# Patient Record
Sex: Female | Born: 1935 | Race: White | Hispanic: No | Smoking: Never smoker
Health system: Southern US, Community
[De-identification: ages and names within clinical notes are randomized; demographics above are authoritative.]

## PROBLEM LIST (undated history)

## (undated) DIAGNOSIS — C50919 Malignant neoplasm of unspecified site of unspecified female breast: Secondary | ICD-10-CM

## (undated) DIAGNOSIS — I499 Cardiac arrhythmia, unspecified: Secondary | ICD-10-CM

## (undated) DIAGNOSIS — M1990 Unspecified osteoarthritis, unspecified site: Secondary | ICD-10-CM

## (undated) DIAGNOSIS — N189 Chronic kidney disease, unspecified: Secondary | ICD-10-CM

## (undated) DIAGNOSIS — I5189 Other ill-defined heart diseases: Secondary | ICD-10-CM

## (undated) DIAGNOSIS — K219 Gastro-esophageal reflux disease without esophagitis: Secondary | ICD-10-CM

## (undated) DIAGNOSIS — F0390 Unspecified dementia without behavioral disturbance: Secondary | ICD-10-CM

## (undated) DIAGNOSIS — E785 Hyperlipidemia, unspecified: Secondary | ICD-10-CM

## (undated) DIAGNOSIS — R002 Palpitations: Secondary | ICD-10-CM

## (undated) DIAGNOSIS — R55 Syncope and collapse: Secondary | ICD-10-CM

## (undated) DIAGNOSIS — C801 Malignant (primary) neoplasm, unspecified: Secondary | ICD-10-CM

## (undated) DIAGNOSIS — R413 Other amnesia: Secondary | ICD-10-CM

## (undated) DIAGNOSIS — M810 Age-related osteoporosis without current pathological fracture: Secondary | ICD-10-CM

## (undated) DIAGNOSIS — I10 Essential (primary) hypertension: Secondary | ICD-10-CM

## (undated) HISTORY — DX: Chronic kidney disease, unspecified: N18.9

## (undated) HISTORY — DX: Essential (primary) hypertension: I10

## (undated) HISTORY — DX: Cardiac arrhythmia, unspecified: I49.9

## (undated) HISTORY — DX: Palpitations: R00.2

## (undated) HISTORY — DX: Unspecified osteoarthritis, unspecified site: M19.90

## (undated) HISTORY — DX: Malignant neoplasm of unspecified site of unspecified female breast: C50.919

## (undated) HISTORY — DX: Syncope and collapse: R55

## (undated) HISTORY — DX: Other ill-defined heart diseases: I51.89

## (undated) HISTORY — DX: Malignant (primary) neoplasm, unspecified: C80.1

## (undated) HISTORY — DX: Unspecified dementia, unspecified severity, without behavioral disturbance, psychotic disturbance, mood disturbance, and anxiety: F03.90

## (undated) HISTORY — DX: Hyperlipidemia, unspecified: E78.5

## (undated) HISTORY — DX: Other amnesia: R41.3

## (undated) HISTORY — DX: Gastro-esophageal reflux disease without esophagitis: K21.9

## (undated) HISTORY — DX: Age-related osteoporosis without current pathological fracture: M81.0

## (undated) HISTORY — PX: OTHER SURGICAL HISTORY: SHX169

---

## 2006-08-05 ENCOUNTER — Inpatient Hospital Stay: Payer: Self-pay | Admitting: Cardiovascular Disease

## 2006-08-05 ENCOUNTER — Other Ambulatory Visit: Payer: Self-pay

## 2006-10-04 ENCOUNTER — Ambulatory Visit: Payer: Self-pay | Admitting: Cardiology

## 2006-10-13 ENCOUNTER — Encounter: Payer: Self-pay | Admitting: Internal Medicine

## 2006-10-13 ENCOUNTER — Ambulatory Visit: Payer: Self-pay

## 2006-10-28 ENCOUNTER — Ambulatory Visit: Payer: Self-pay | Admitting: Cardiology

## 2007-01-04 ENCOUNTER — Emergency Department (HOSPITAL_COMMUNITY): Admission: EM | Admit: 2007-01-04 | Discharge: 2007-01-04 | Payer: Self-pay | Admitting: Family Medicine

## 2007-02-04 ENCOUNTER — Ambulatory Visit: Payer: Self-pay | Admitting: Cardiology

## 2007-02-17 ENCOUNTER — Ambulatory Visit: Payer: Self-pay | Admitting: Cardiology

## 2007-08-03 ENCOUNTER — Ambulatory Visit: Payer: Self-pay | Admitting: Rheumatology

## 2007-11-10 ENCOUNTER — Ambulatory Visit: Payer: Self-pay | Admitting: Cardiology

## 2007-11-10 ENCOUNTER — Ambulatory Visit: Payer: Self-pay | Admitting: Pain Medicine

## 2007-11-28 ENCOUNTER — Ambulatory Visit: Payer: Self-pay | Admitting: Pain Medicine

## 2008-11-19 ENCOUNTER — Ambulatory Visit: Payer: Self-pay | Admitting: Internal Medicine

## 2009-02-06 ENCOUNTER — Encounter: Payer: Self-pay | Admitting: Internal Medicine

## 2009-02-06 ENCOUNTER — Ambulatory Visit: Payer: Self-pay | Admitting: Internal Medicine

## 2009-02-06 DIAGNOSIS — R002 Palpitations: Secondary | ICD-10-CM | POA: Insufficient documentation

## 2009-02-06 DIAGNOSIS — I10 Essential (primary) hypertension: Secondary | ICD-10-CM

## 2009-07-25 ENCOUNTER — Encounter: Admission: RE | Admit: 2009-07-25 | Discharge: 2009-07-25 | Payer: Self-pay | Admitting: Neurology

## 2010-10-29 ENCOUNTER — Telehealth: Payer: Self-pay | Admitting: Internal Medicine

## 2010-12-19 ENCOUNTER — Encounter: Payer: Self-pay | Admitting: Internal Medicine

## 2010-12-19 ENCOUNTER — Ambulatory Visit
Admission: RE | Admit: 2010-12-19 | Discharge: 2010-12-19 | Payer: Self-pay | Source: Home / Self Care | Attending: Internal Medicine | Admitting: Internal Medicine

## 2010-12-19 DIAGNOSIS — I5032 Chronic diastolic (congestive) heart failure: Secondary | ICD-10-CM | POA: Insufficient documentation

## 2010-12-19 DIAGNOSIS — I498 Other specified cardiac arrhythmias: Secondary | ICD-10-CM | POA: Insufficient documentation

## 2010-12-21 ENCOUNTER — Encounter: Payer: Self-pay | Admitting: Neurology

## 2010-12-30 NOTE — Progress Notes (Signed)
Summary: RX  Phone Note Refill Request Call back at Home Phone [phone] Message from:  Patient on October 29, 2010 1:15 PM  Pt needs medication called in for dental work.  [facility] [address].  Initial call taken by: Harlon Flor,  October 29, 2010 1:15 PM  Follow-up for Phone Call        Pt's daughter is calling about this again. Follow-up by: Harlon Flor,  October 30, 2010 11:18 AM  Additional Follow-up for Phone Call Additional follow up Details #1::        Please let me know if needs pre meds. for dental work. Additional Follow-up by: Bishop Dublin, CMA,  October 31, 2010 11:59 AM    Additional Follow-up for Phone Call Additional follow up Details #2::    Notified daughter per Dr. Gala Romney no need for pre meds (antibiotics) for dental work.  Follow-up by: Bishop Dublin, CMA,  October 31, 2010 5:00 PM

## 2011-01-01 NOTE — Assessment & Plan Note (Signed)
Summary: ROV/AMD   Visit Type:  Follow-up Primary Provider:  Dr. Burnett Sheng  CC:  c/o fluttering in chest with fatigue.Marland Kitchen  History of Present Illness: Erin Shannon is a 77 year old woman with a history of palpitations, diastolic dysfunction and dementia. She returns today for 2 year followup.  She says she is feeling well. She denies palpitations but son says she gets occasional chest flutters.  Says breathing is pretty good. Main limitation is in R knee.  She denies any orthopnea, PND or lower extremity edema. She has not had any chest pain.  Preventive Screening-Counseling & Management  Alcohol-Tobacco     Smoking Status: never  Caffeine-Diet-Exercise     Does Patient Exercise: yes      Drug Use:  no.    Current Medications (verified): 1)  Namenda 10 Mg Tabs (Memantine Hcl) .Marland Kitchen.. 1 By Mouth Twice Daily 2)  Aricept 10 Mg Tabs (Donepezil Hcl) .Marland Kitchen.. 1 By Mouth Daily 3)  Cardizem Cd 120 Mg Xr24h-Cap (Diltiazem Hcl Coated Beads) .Marland Kitchen.. 1 By Mouth Twice Daily 4)  Lutein 6 Mg Caps (Lutein) .Marland Kitchen.. 1 By Mouth Daily 5)  Pravastatin Sodium 40 Mg Tabs (Pravastatin Sodium) .Marland Kitchen.. 1 By Mouth Daily 6)  Complete Womens  Tabs (Multiple Vitamins-Minerals) .... One Once Daily 7)  Calcium Citrate With Vitamin D 630mg /500 .... One Daily 8)  Tramadol Hcl 50 Mg Tabs (Tramadol Hcl) .... One Tablet Every 4-6 Hours As Needed  Allergies (verified): No Known Drug Allergies  Past History:  Past Medical History: Last updated: 02/05/2009  1. Palpitations     a.  monitor with PACs/PVCs     b.  negative myoview 2007  2. Diastolic dysfunction     a. echo 11/07. EF 60-65%. mild to moderate PI  3. Breast cancer s/p mastectomy  4. Memory loss  5. GERD  6. HTN  7. Hyperlipidemia  Social History: Last updated: 12/19/2010 Widowed  Retired  Tobacco Use - No.  Alcohol Use - no Regular Exercise - yes works in yard Drug Use - no  Risk Factors: Exercise: yes (12/19/2010)  Risk Factors: Smoking Status: never  (12/19/2010)  Past Surgical History: hysterectomy mastectomy right  Social History: Widowed  Retired  Tobacco Use - No.  Alcohol Use - no Regular Exercise - yes works in yard Drug Use - no Smoking Status:  never Does Patient Exercise:  yes Drug Use:  no  Review of Systems       As per HPI and past medical history; otherwise all systems negative.   Vital Signs:  Patient profile:   77 year old female Height:      57 inches Weight:      133 pounds BMI:     28.88 Pulse rate:   58 / minute BP sitting:   146 / 72  (left arm) Cuff size:   regular  Vitals Entered By: Bishop Dublin, CMA (December 19, 2010 2:17 PM)  Physical Exam  General:   well appearing. no resp difficulty HEENT: normal Neck: supple. no JVD. Carotids 2+ bilat; not bruits. No lymphadenopathy or thryomegaly appreciated. Cor: PMI nondisplaced  Bradycardid and regular No rubs, murmur +s4 Lungs: clear Abdomen: soft, nontender, nondistended. No hepatosplenomegaly. No bruits or masses. Good bowel sounds. Extremities: no cyanosis, clubbing, rash, edema Neuro: alert , cranial nerves grossly intact. moves all 4 extremities w/o difficulty. affect pleasant    Impression & Recommendations:  Problem # 1:  PALPITATIONS (ICD-785.1) Very mild. Continue current regimen.  Problem # 2:  BRADYCARDIA (ICD-427.89)  Mildly bradycardic with signifcant 1AVB. This is asymptomatic. I considered stopping cardizem but it is low dose and seems to be suppressing palpitations well so will continue. Told her to contact us if she develops dizziness or presyncope. No neer for pacer at this point.   Problem # 3:  DIASTOLIC HEART FAILURE, CHRONIC (ICD-428.32) Volume status looks good. Continue as needed lasix.

## 2011-04-14 NOTE — Assessment & Plan Note (Signed)
[facility]                            CARDIOLOGY OFFICE NOTE   Erin Shannon, Erin Shannon                        MRN:          [id number]  DATE:11/10/2007                            DOB:          Apr 25, 1927    Ms. Mcdougald returns today for further management of her palpitations.   Her biggest complaint today is that of fatigue.  When I first met her  last November, this was also her major complaint.  After eliminating  metoprolol extended release at 200 mg a day, she felt somewhat better.   On her last visit, she had a terrible taste in her mouth.  We eliminated  fish oil, flax seed oil.  It seems to have helped.   Her son is very concerned about her lack of energy and frequent naps.  She has seen Dr. Burnett Sheng recently who has done what sounds like  extensive blood work and an evaluation on this.  No etiology has been  found per her son.   They would like to cut her Diltiazem back.  For whatever reason, she is  taking Diltiazem 120 mg b.i.d.  The last time we saw her she was taking  Diltiazem extended release 120 daily.  She is on furosemide 20 mg a day,  Aricept 10 mg a day, potassium 10 mEq a day, and Namenda 10 mg b.i.d.   Her blood pressure today is elevated at 173/87.  She is very upset  because she had to wait, she said, an hour and a half in the waiting  area.  Her pulse is 61 and regular, weight is 113.  She is down 8  pounds.  HEENT:  Unchanged.  Carotid upstrokes were equal bilaterally without  bruits.  Thyroid is not enlarged, trachea is midline.  LUNGS:  Clear.  HEART:  Reveals a respiratory rate.  ABDOMINAL EXAM:  Soft, good bowel sounds.  EXTREMITIES:  Reveal no edema.  Pulses are intact.  NEURO EXAM:  Intact.   EKG shows sinus rhythm with a 1st degree AV block with no RSR prime in  V1 with occasional PVC.  Compared to her previous EKG, there has been no  significant change except for the RSR prime in V1.  She has a 1st degree  AV block  which was there before.   ASSESSMENT AND PLAN:  I am not sure what is causing Ms. Feil's  multiple complaints, especially her profound fatigue.  I will leave this  to her primary to investigate this, which it  sounds like he already has initiated.  Will decrease the Diltiazem to  Diltiazem extended release 120 daily.  I will see her back on a p.r.n.  basis.     Thomas C. Daleen Squibb, MD, [facility]  Electronically Signed    TCW/MedQ  DD: 11/10/2007  DT: 11/11/2007  Job #: [id number]   cc:   Jerl Mina

## 2011-04-14 NOTE — Assessment & Plan Note (Signed)
[facility] OFFICE NOTE   Erin Shannon, Erin Shannon                        MRN:          [id number]  DATE:11/19/2008                            DOB:          Dec 30, 1926    PRIMARY CARE PHYSICIAN:  Jerl Mina   INTERVAL HISTORY:  Erin Shannon is 77 year old woman previously followed  by Dr. Juanito Doom for palpitations.  She has no documented history of  coronary artery disease.  She did have a Myoview in November 2007, which  showed normal perfusion.  Ejection fraction was not calculated due to  frequent PACs and PVCs.  She did have an echocardiogram, which showed an  EF of 60%-65% with diastolic dysfunction and mild-to-moderate pulmonic  regurgitation and mild mitral regurgitation.   She returns today for followup.  She says that she frequently feels her  heart is beating strong.  She said this is primarily when she is pushing  herself out and running around.  She has not had any chest pain or  tachypalpitations.  She says she can tell that her heart is beating  strong.  She has not had any syncope or presyncope.  No dyspnea.  When  she notices these events, she takes an extra half pill of diltiazem and  helps a little bit.   The remainder review of systems is also notable for breast cancer,  status post mastectomy, some memory loss, gastroesophageal reflux  disease, hypertension, and hyperlipidemia.  She has some mild plaquing  in her carotid arteries.   CURRENT MEDICATIONS:  1. Namenda 10 b.i.d.  2. Diltiazem 120 b.i.d.  3. Lutein 6 mg a day.  4. Pravastatin 40 a day.  5. Lasix 20 mg p.r.n.   PHYSICAL EXAMINATION:  GENERAL:  She is an elderly woman, no acute  distress.  She ambulates around the clinic without any respiratory  difficulty.  VITAL SIGNS:  Blood pressure is 134/70, heart rate 72, and weight is  115.  HEENT:  Normal.  NECK:  Supple with no obvious JVD.  She does have some kyphosis.  Carotids are  2+ bilaterally without any bruits.  There is no  lymphadenopathy or thyromegaly.  CARDIAC:  PMI is nondisplaced.  Regular rate and rhythm with S4.  No  murmurs.  LUNGS:  Clear.  ABDOMEN:  Soft, nontender, and nondistended.  No hepatosplenomegaly.  No  bruits.  No masses.  Good bowel sounds.  EXTREMITIES:  Warm with no cyanosis, clubbing, or edema.  SKIN:  No rash.  NEUROLOGIC:  Alert and oriented x3.  Cranial nerves II through XII are  intact.  Moves all 4 extremities without difficulty.  Affect is  pleasant.   EKG shows sinus rhythm with a first-degree AV block.  His heart rate is  72.  PR duration is 242 milliseconds.   ASSESSMENT AND PLAN:  1. Palpitations.  We will go ahead and put a 2-week LifeWatch monitor      on her to further evaluate.  I told her it is okay to continue to      take an occasional  tablet of diltiazem p.r.n. as needed.  2. Chronic hypertension.  Blood pressure is mildly elevated.  We will      continue current therapy.  If it jumps up any further, I would      consider either increasing her diltiazem or adding another agent.  3. Hyperlipidemia, is followed by Dr. Burnett Sheng.   DISPOSITION:  We will see her back in 4-6 weeks to see how she is doing.     Bevelyn Buckles. Bensimhon, MD  Electronically Signed    DRB/MedQ  DD: 11/19/2008  DT: 11/20/2008  Job #: [id number]   cc:   Jerl Mina

## 2011-04-17 NOTE — Assessment & Plan Note (Signed)
[facility] OFFICE NOTE   Erin, Shannon                        MRN:          [id number]  DATE:10/04/2006                            DOB:          1927-06-02    I was asked by Dr. Lacie Scotts to evaluate Erin Shannon, a delightful 77-year-  old quite active widowed white female for increased shortness of breath and  palpitations.   HISTORY OF PRESENT ILLNESS:  She is very active at home in her garden.  She  lives alone.  About a month ago she had an episode where her heart was  racing and skipping and had some shortness of breath.  She went to the  [facility].  She was admitted and apparently  discharged on metoprolol extended-release 200 mg a day.   She does not remember any of the real details of that hospitalization; I  have no records today.  She was told she did not have a heart attack.   Since that time she has been extremely fatigued.  She has had a lot of  dyspnea on exertion which has been progressively worse over the last few  weeks.  She denies any chest tightness/pressure otherwise.   She has had no presyncope or syncope.   Her cardiac risk factors include age, sex, hypertension, hyperlipidemia, and  a history of cerebrovascular disease.  She has had carotid Dopplers in the  past and been told that she has some plaque in her blood vessels in her  neck.  She is on Plavix for this.   PAST MEDICAL HISTORY:  She is currently on:  1. Caduet 10/80 one daily.  2. Celebrex 200 mg p.o. b.i.d.  3. Omeprazole 20 mg a day.  4. Plavix 75 mg a day.  5. Zyrtec D one p.o. b.i.d.  6. Metoprolol extended-release 200 mg a day.  7. Fosamax 70 mg a day.  8. Multivitamin daily.   She does not smoke, drink or use any recreational drugs.  She rarely drinks  caffeinated beverages.  She does enjoy working in her garden and her yard  and she also bicycles!   She has had a hysterectomy in  late 1990, had a history of breast cancer  status post right mastectomy.  She did not have chemo.   FAMILY HISTORY:  Is really noncontributory.   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  She lives alone.  She has two children and one adopted child.   REVIEW OF SYSTEMS:  She denies any history of any allergies of hay fever, no  asthma, no history of any emphysema or chronic lung disease.  She has had no  gastrointestinal problems other than some occasional reflux.  She denies any  melena, hematochezia.  She has arthritis.  She has had a history of cancer  as noted above.  She has had no history of endocrinology problems and denies  any weight loss, weight gain, heat or cold intolerance.  She is not  diabetic.   EXAMINATION:  GENERAL:  She is very pleasant.  VITAL SIGNS:  Her blood pressure is 168/77, her pulse is 57 in sinus  bradycardia.  She is around 5 feet tall, weighs 126 pounds.  HEENT:  Normocephalic, atraumatic.  She wears glasses.  Facial symmetry is  normal.  PERRLA.  Extraocular movements intact.  Sclerae clear.  Funduscopic  exam reveals some AV nicking and some thickening of the arterioles.  Her  dentition is satisfactory.  Carotid upstrokes are equal bilaterally without  bruits.  There is no JVD.  Thyroid is not enlarged, trachea is midline.  LUNGS:  Clear to auscultation and percussion.  HEART:  Reveals a nondisplaced PMI.  She has normal S1, S2 without murmur or  gallop.  ABDOMEN:  Soft with good bowel sounds.  There is no hepatomegaly.  EXTREMITIES:  Reveal no cyanosis, clubbing or edema.  Pulses are present.  NEUROLOGIC:  Intact.   Electrocardiogram shows sinus bradycardia at a rate of 57 with a first  degree AV block with a PR interval of 234 milliseconds.  Her QRS and QTC are  normal.  She has an RSR-1 in V1 and V2 but no bundle-branch block pattern.   ASSESSMENT:  1. Dyspnea on exertion, particularly worse over the last few weeks.  She      has  multiple cardiac risk factors and we need to rule out coronary      ischemia or ischemic equivalency.  2. Palpitations.  3. Profound fatigue.  This may be related to the dose of metoprolol.  4. Cerebrovascular disease.   PLAN:  1. Continue current medications except decrease from metoprolol extended-      release to 100 mg per day.  Hopefully this will continue to contain and      relieve the palpitations and improve her fatigue.  2. A 2-D echocardiogram to assess LV function and rule out any pulmonary      hypertension or valvular disease.  3. Carotid Dopplers.  4. Adenosine Myoview to rule out obstructive coronary disease.   I will have her return in 2-3 weeks to discuss the findings of these  studies.  Hopefully, she will also be symptomatically improved.     Thomas C. Daleen Squibb, MD, [facility]  Electronically Signed    TCW/MedQ  DD: 10/04/2006  DT: 10/05/2006  Job #: [id number]   cc:   Evelene Croon

## 2011-04-17 NOTE — Assessment & Plan Note (Signed)
[facility] OFFICE NOTE   MAEKAYLA, GIORGIO                        MRN:          [id number]  DATE:02/04/2007                            DOB:          04-22-1927    Ms. Wurzel returns today for further management of palpitations. She and  her son are both very concerned that her memory has declined. Since she  was here last time, she has been started on lisinopril by Dr. Lacie Scotts,  presumably for blood pressure. She is on a lot more medicines than she  actually shared with me the first time.   She is on:  1. Omeprazole 20 daily.  2. Plavix 75 mg a day.  3. Zyrtec 1 b.i.d.  4. Metoprolol 50 mg a day.  5. Fosamax 70 mg every week.  6. Multivitamin.  7. Potassium 10 mEq a day.  8. Amlodipine 5 mg a day.  9. Lisinopril 10 mg a day.  10.Furosemide 20 mg a day.  11.Aricept 10 mg a day.  12.Pravastatin 40 mg a day.   Her blood pressure today is 120/72 with pulse 57 and regular. Her weight  is 122. She is no acute distress.  LUNGS:  Clear.  HEART:  Reveals a regular rate and rhythm. There is no JVD. Carotids  were full.  ABDOMINAL EXAM:  Soft, good bowel sounds.  EXTREMITIES:  Revealed no edema. Pulses intact.   Electrocardiogram shows sinus rhythm with first degree AV block, PACs,  and PVCs.   She is really troubled by these palpitations. She is very concerned as  is her son that metoprolol is the culprit for her memory loss.   PLAN:  1. Discontinue metoprolol. I have replaced it with diltiazem extended      release 120 a day. With this, I have stopped the amlodipine.  2. I would like to see her back in two weeks to see if she is better.      If she is not, we will look into further changes.    Thomas C. Daleen Squibb, MD, [facility]  Electronically Signed   TCW/MedQ  DD: 02/04/2007  DT: 02/04/2007  Job #: [id number]

## 2011-04-17 NOTE — Assessment & Plan Note (Signed)
[facility] OFFICE NOTE   Erin Shannon, Erin Shannon                        MRN:          [id number]  DATE:10/28/2006                            DOB:          [birth year]    Erin Shannon returns today for further management of her shortness of  breath and palpitations.   Please see my note from October 04, 2006.   The results of the following tests were discussed at length with her and  her son.  1. Normal carotid arteries with antegrade flow in both vertebrals.  I      had heard a systolic sound in her neck.  2. Adenosine Myoview with no evidence of ischemia and normal wall      function.  She did have frequent PVCs and her EF could not be      evaluated or gated.  3. A 2D echocardiogram showing normal left ventricular systolic      function.  No important valvular problems or abnormalities.  No      evidence of LVH.  EF 60% to 65%.  She did have some mild left      atrial enlargement and mild pulmonary regurgitation.  RV function      was normal.   Her metoprolol had been reduced from 200 to 100 on her last office  visit.  She cut it to 50 and actually stopped it for a few days.  This  was because of fatigue.  It did not seem to really matter.  Her  palpitations have been better on this drug.   Her blood pressure today was 136/88 in the left arm, her pulse is 75 and  regular.  The rest of her exam is unchanged.   ASSESSMENT AND PLAN:  I spent 20 minutes or so talking to Erin Shannon  and her son.  I have written her for metoprolol ER 50 mg a day, which I  would prefer her to continue.  If she is still having profound fatigue  after several weeks, she can stop it.  If she has recurrent major issues  with palpitations or tachycardia, we will reevaluate.   We will see her back otherwise on a p.r.n. basis.     Thomas C. Daleen Squibb, MD, [facility]  Electronically Signed    TCW/MedQ  DD: 10/28/2006  DT: 10/28/2006  Job #:  [id number]   cc:   Evelene Croon

## 2011-04-17 NOTE — Assessment & Plan Note (Signed)
[facility] OFFICE NOTE   KESSLER, KOPINSKI                        MRN:          [id number]  DATE:02/17/2007                            DOB:          1927/09/13    Erin Shannon comes in today for further management of her palpitations and  recent problems with her blood pressure.   Please see my last note on February 04, 2007.   Her biggest complaint today is that she has a terrible taste in her  mouth.  She continues to lose weight.  It is really not a metallic  taste.  She wanted me to eliminate any drugs that were causing that!   I did review her list and told her that the fish oil/flaxseed oil might  cause some dyspepsia.  Also suggested that she get off a lot of these  multivitamin supplements.  Finally, I told her to talk to Dr. Lacie Shannon  about Fosamax which can cause gastroesophageal reflux and perhaps some  distaste.   She is tolerating the Diltiazem extended release 120 a day.  Her  palpitations are still an issue.  They are not as frequent.  Her blood  pressure today is 160/60, her pulse 52 and regular.  Her weight is 121.  She is in no acute distress.  Her exam otherwise unchanged.   ASSESSMENT AND PLAN:  Erin Shannon has normal left ventricular function  by echo and has a negative stress Myoview.  Her palpitations are  probably benign as I have tried to reassure her.  Her blood pressure may  need additional attention with Dr. Lacie Shannon.  This bad taste in her  mouth hopefully will resolve with some of the discontinuation with  medicines listed above.   At this point, I do not have much else to offer.  I made this very clear  to her.  I will see her back on a p.r.n. basis.     Thomas C. Daleen Squibb, MD, [facility]  Electronically Signed    TCW/MedQ  DD: 02/17/2007  DT: 02/17/2007  Job #: [id number]   cc:   Erin Shannon

## 2011-05-20 ENCOUNTER — Encounter: Payer: Self-pay | Admitting: Cardiovascular Disease

## 2011-09-06 ENCOUNTER — Observation Stay: Payer: Self-pay | Admitting: Internal Medicine

## 2014-07-27 ENCOUNTER — Encounter (INDEPENDENT_AMBULATORY_CARE_PROVIDER_SITE_OTHER): Payer: Self-pay

## 2014-07-27 ENCOUNTER — Ambulatory Visit (INDEPENDENT_AMBULATORY_CARE_PROVIDER_SITE_OTHER): Payer: Medicare Other | Admitting: Cardiovascular Disease

## 2014-07-27 ENCOUNTER — Encounter: Payer: Self-pay | Admitting: Cardiovascular Disease

## 2014-07-27 VITALS — BP 118/70 | HR 72 | Ht <= 58 in | Wt 137.2 lb

## 2014-07-27 DIAGNOSIS — R002 Palpitations: Secondary | ICD-10-CM

## 2014-07-27 DIAGNOSIS — I872 Venous insufficiency (chronic) (peripheral): Secondary | ICD-10-CM

## 2014-07-27 DIAGNOSIS — I10 Essential (primary) hypertension: Secondary | ICD-10-CM

## 2014-07-27 NOTE — Progress Notes (Signed)
HPI  This is an 79 year old female who is here today to establish cardiovascular care. She was seen in the past by Dr. wall for palpitations thought to be due to premature beats. She was treated with diltiazem for both that and hypertension. She has done well but suffers from advanced dementia. She had recent worsening of lower extremity edema which improved with conservative measures. She denies any chest pain, shortness of breath, palpitations or dizziness.  She used to be on metoprolol in the past which was discontinued due to concerns about memory loss.  No Known Allergies   Current Outpatient Prescriptions on File Prior to Visit  Medication Sig Dispense Refill  . Calcium Citrate-Vitamin D (CALCIUM CITRATE + D PO) Take 1 tablet by mouth daily. 630mg /500       . donepezil (ARICEPT) 10 MG tablet Take 10 mg by mouth daily.        . memantine (NAMENDA) 10 MG tablet Take 10 mg by mouth 2 (two) times daily.        . Multiple Vitamins-Minerals (COMPLETE WOMENS) TABS Take 1 tablet by mouth daily.        . pravastatin (PRAVACHOL) 40 MG tablet Take 40 mg by mouth daily.         No current facility-administered medications on file prior to visit.     Past Medical History  Diagnosis Date  . Palpitations     monitor with PACs/PVCs. neg myoview 2007  . Diastolic dysfunction     echo 11/07. EF 60-65%. mild to moderate PI  . Memory loss   . GERD (gastroesophageal reflux disease)   . HTN (hypertension)   . Hyperlipidemia   . Syncope and collapse   . Breast cancer     s/p mastectomy  . Arrhythmia      Past Surgical History  Procedure Laterality Date  . Hysterectomy-unspecified area    . Masectomy      R     Family History  Problem Relation Age of Onset  . Heart failure Father   . Heart disease Brother      History   Social History  . Marital Status: Widowed    Spouse Name: N/A    Number of Children: N/A  . Years of Education: N/A   Occupational History  . Not on  file.   Social History Main Topics  . Smoking status: Never Smoker   . Smokeless tobacco: Not on file     Comment: tobacco use -no  . Alcohol Use: No  . Drug Use: No  . Sexual Activity: Not on file   Other Topics Concern  . Not on file   Social History Narrative   Widowed, retired, gets regular exercise-works in yard.     ROS A 10 point review of system was performed. It is negative other than that mentioned in the history of present illness.   PHYSICAL EXAM   BP 118/70  Pulse 72  Ht 4\' 7"  (1.397 m)  Wt 137 lb 4 oz (62.256 kg)  BMI 31.90 kg/m2 Constitutional: She is oriented to person, place, and time. She appears well-developed and well-nourished. No distress.  HENT: No nasal discharge.  Head: Normocephalic and atraumatic.  Eyes: Pupils are equal and round. No discharge.  Neck: Normal range of motion. Neck supple. No JVD present. No thyromegaly present.  Cardiovascular: Normal rate, regular rhythm, normal heart sounds. Exam reveals no gallop and no friction rub. No murmur heard.  Pulmonary/Chest: Effort normal and breath sounds  normal. No stridor. No respiratory distress. She has no wheezes. She has no rales. She exhibits no tenderness.  Abdominal: Soft. Bowel sounds are normal. She exhibits no distension. There is no tenderness. There is no rebound and no guarding.  Musculoskeletal: Normal range of motion. She exhibits  trace edema and no tenderness.  Neurological: She is alert and oriented to person, place, and time. Coordination normal.  Skin: Skin is warm and dry. No rash noted. She is not diaphoretic. No erythema. No pallor.  Psychiatric: She has a normal mood and affect. Her behavior is normal. Judgment and thought content normal.     EPP:IRJJO  Rhythm  -First degree A-V block  - frequent PAC s  PRi = 262  # PACs = 2. Low voltage in precordial leads.   -Old inferior infarct  -Poor R-wave progression -may be secondary to pulmonary disease   consider old anterior  infarct.    ASSESSMENT AND PLAN

## 2014-07-27 NOTE — Assessment & Plan Note (Signed)
These are reasonably controlled with diltiazem. If lower extremity edema becomes a chronic issue, we should consider switching this to metoprolol.

## 2014-07-27 NOTE — Patient Instructions (Signed)
Continue same medications.   Your physician wants you to follow-up in: 12 months.  You will receive a reminder letter in the mail two months in advance. If you don't receive a letter, please call our office to schedule the follow-up appointment.

## 2014-07-27 NOTE — Assessment & Plan Note (Signed)
Blood pressure is well controlled on current medications. 

## 2014-07-27 NOTE — Assessment & Plan Note (Signed)
Bilateral leg edema is likely due to chronic venous insufficiency. Diltiazem might also be contributing. This improved with conservative measures including leg elevation. She can also use knee-high support stockings.

## 2015-01-17 ENCOUNTER — Other Ambulatory Visit: Payer: Self-pay | Admitting: *Deleted

## 2015-01-17 ENCOUNTER — Telehealth: Payer: Self-pay | Admitting: Cardiovascular Disease

## 2015-01-17 MED ORDER — DILTIAZEM HCL ER COATED BEADS 240 MG PO CP24
240.0000 mg | ORAL_CAPSULE | Freq: Every day | ORAL | Status: DC
Start: 2015-01-17 — End: 2015-01-17

## 2015-01-17 MED ORDER — DILTIAZEM HCL ER COATED BEADS 240 MG PO CP24
240.0000 mg | ORAL_CAPSULE | Freq: Every day | ORAL | Status: DC
Start: 2015-01-17 — End: 2015-06-13

## 2015-01-17 NOTE — Telephone Encounter (Signed)
°  1. Which medications need to be refilled? Diltiazem 120 mg twice daily  2. Which pharmacy is medication to be sent to? [facility]   3. Do they need a 30 day or 90 day supply? Not specified   4. Would they like a call back once the medication has been sent to the pharmacy? Yes please call daughter

## 2015-01-17 NOTE — Telephone Encounter (Signed)
Pt daughter asking for 90 day supply..see below

## 2015-06-13 ENCOUNTER — Telehealth: Payer: Self-pay | Admitting: *Deleted

## 2015-06-13 ENCOUNTER — Other Ambulatory Visit: Payer: Self-pay | Admitting: *Deleted

## 2015-06-13 MED ORDER — DILTIAZEM HCL ER COATED BEADS 240 MG PO CP24: 240.0000 mg | ORAL_CAPSULE | Freq: Every day | ORAL | Status: DC

## 2015-06-13 NOTE — Telephone Encounter (Signed)
°  1. Which medications need to be refilled? Diltizem twice a day   2. Which pharmacy is medication to be sent to? [facility] [address]  3. Do they need a 30 day or 90 day supply? 90   4. Would they like a call back once the medication has been sent to the pharmacy? No

## 2015-07-29 ENCOUNTER — Encounter: Payer: Self-pay | Admitting: Cardiovascular Disease

## 2015-07-29 ENCOUNTER — Ambulatory Visit (INDEPENDENT_AMBULATORY_CARE_PROVIDER_SITE_OTHER): Payer: Medicare Other | Admitting: Cardiovascular Disease

## 2015-07-29 VITALS — BP 128/64 | HR 70 | Ht <= 58 in | Wt 137.5 lb

## 2015-07-29 DIAGNOSIS — I10 Essential (primary) hypertension: Secondary | ICD-10-CM

## 2015-07-29 DIAGNOSIS — R002 Palpitations: Secondary | ICD-10-CM | POA: Diagnosis not present

## 2015-07-29 NOTE — Patient Instructions (Signed)
Medication Instructions: Continue same medications.   Labwork: None.   Procedures/Testing: None.   Follow-Up: 1 year with Dr. Jyoti Harju  Any Additional Special Instructions Will Be Listed Below (If Applicable).   

## 2015-07-29 NOTE — Assessment & Plan Note (Signed)
Blood pressure is controlled on diltiazem.

## 2015-07-29 NOTE — Progress Notes (Signed)
HPI  This is an 81 year old female who is here today for a follow-up visit regarding palpitations thought to be due to PVCs.  She was treated with diltiazem for both that and hypertension. She has done well but suffers from advanced dementia. She denies any chest pain, shortness of breath, palpitations or dizziness.  She continues to live by herself but gets support from her kids. She had worsening leg edema last year that improved with leg elevation and support stockings.  No Known Allergies   Current Outpatient Prescriptions on File Prior to Visit  Medication Sig Dispense Refill  . alendronate (FOSAMAX) 70 MG tablet Take 70 mg by mouth once a week. Take with a full glass of water on an empty stomach.    . Calcium Citrate-Vitamin D (CALCIUM CITRATE + D PO) Take 1 tablet by mouth daily. 630mg /500     . celecoxib (CELEBREX) 200 MG capsule Take 200 mg by mouth daily.    Marland Kitchen diltiazem (CARDIZEM CD) 240 MG 24 hr capsule Take 1 capsule (240 mg total) by mouth daily. 90 capsule 3  . donepezil (ARICEPT) 10 MG tablet Take 10 mg by mouth daily.      . memantine (NAMENDA) 10 MG tablet Take 10 mg by mouth 2 (two) times daily.      . Multiple Vitamins-Minerals (COMPLETE WOMENS) TABS Take 1 tablet by mouth daily.      . pravastatin (PRAVACHOL) 40 MG tablet Take 40 mg by mouth daily.       No current facility-administered medications on file prior to visit.     Past Medical History  Diagnosis Date  . Palpitations     monitor with PACs/PVCs. neg myoview 2007  . Diastolic dysfunction     echo 11/07. EF 60-65%. mild to moderate PI  . Memory loss   . GERD (gastroesophageal reflux disease)   . HTN (hypertension)   . Hyperlipidemia   . Syncope and collapse   . Breast cancer     s/p mastectomy  . Arrhythmia      Past Surgical History  Procedure Laterality Date  . Hysterectomy-unspecified area    . Masectomy      R     Family History  Problem Relation Age of Onset  . Heart failure Father    . Heart disease Brother      Social History   Social History  . Marital Status: Widowed    Spouse Name: N/A  . Number of Children: N/A  . Years of Education: N/A   Occupational History  . Not on file.   Social History Main Topics  . Smoking status: Never Smoker   . Smokeless tobacco: Not on file     Comment: tobacco use -no  . Alcohol Use: No  . Drug Use: No  . Sexual Activity: Not on file   Other Topics Concern  . Not on file   Social History Narrative   Widowed, retired, gets regular exercise-works in yard.     ROS A 10 point review of system was performed. It is negative other than that mentioned in the history of present illness.   PHYSICAL EXAM   BP 128/64 mmHg  Pulse 70  Ht 4\' 7"  (1.397 m)  Wt 137 lb 8 oz (62.37 kg)  BMI 31.96 kg/m2 Constitutional: She is oriented to person, place, and time. She appears well-developed and well-nourished. No distress.  HENT: No nasal discharge.  Head: Normocephalic and atraumatic.  Eyes: Pupils are equal and round.  No discharge.  Neck: Normal range of motion. Neck supple. No JVD present. No thyromegaly present.  Cardiovascular: Normal rate, regular rhythm, normal heart sounds. Exam reveals no gallop and no friction rub. No murmur heard.  Pulmonary/Chest: Effort normal and breath sounds normal. No stridor. No respiratory distress. She has no wheezes. She has no rales. She exhibits no tenderness.  Abdominal: Soft. Bowel sounds are normal. She exhibits no distension. There is no tenderness. There is no rebound and no guarding.  Musculoskeletal: Normal range of motion. She exhibits  trace edema and no tenderness.  Neurological: She is alert and oriented to person, place, and time. Coordination normal.  Skin: Skin is warm and dry. No rash noted. She is not diaphoretic. No erythema. No pallor.  Psychiatric: She has a normal mood and affect. Her behavior is normal. Judgment and thought content normal.     EKG: Sinus  Rhythm   -First degree A-V block  PRi = 276 Low voltage in precordial leads.   -Old inferior infarct  -Poor R-wave progression -may be secondary to pulmonary disease   consider old anterior infarct.   ABNORMAL     ASSESSMENT AND PLAN

## 2015-07-29 NOTE — Assessment & Plan Note (Signed)
No evidence of PVCs or symptomatic palpitations. Continue current dose of diltiazem.

## 2016-02-03 ENCOUNTER — Telehealth: Payer: Self-pay | Admitting: Cardiovascular Disease

## 2016-02-03 NOTE — Telephone Encounter (Signed)
Pt daughter called, states pt has to have dental work(fillings) on Thursday, not sure if she needs to hold any medication, or needs any medication for this appt. Please call and advise.

## 2016-02-04 NOTE — Telephone Encounter (Signed)
S/w pt daughter, Neldon Mc, who states pt having fillings at the dentist on Thursday.  States when she use to be a pt of Dr. Brunetta Genera, he would prescribe "something for her to take before dental procedures" however, daughter could not tell me what the medication was or what it was prescribed for.  Reviewed cardiac meds w/daughter as well as confirmed pt is not taking blood thinner. Advised daughter to continue cardiac meds as scheduled and contact PCP for advice regarding other medications.  Daughter verbalized understanding and is appreciative of the call.

## 2016-06-23 ENCOUNTER — Other Ambulatory Visit: Payer: Self-pay | Admitting: Cardiovascular Disease

## 2016-07-23 ENCOUNTER — Ambulatory Visit: Payer: Medicare Other | Admitting: Cardiovascular Disease

## 2016-07-24 ENCOUNTER — Encounter (INDEPENDENT_AMBULATORY_CARE_PROVIDER_SITE_OTHER): Payer: Self-pay

## 2016-07-24 ENCOUNTER — Encounter: Payer: Self-pay | Admitting: Cardiovascular Disease

## 2016-07-24 ENCOUNTER — Ambulatory Visit (INDEPENDENT_AMBULATORY_CARE_PROVIDER_SITE_OTHER): Payer: Medicare Other | Admitting: Cardiovascular Disease

## 2016-07-24 VITALS — BP 140/78 | HR 65 | Ht <= 58 in | Wt 135.8 lb

## 2016-07-24 DIAGNOSIS — I493 Ventricular premature depolarization: Secondary | ICD-10-CM | POA: Diagnosis not present

## 2016-07-24 DIAGNOSIS — I10 Essential (primary) hypertension: Secondary | ICD-10-CM

## 2016-07-24 MED ORDER — DILTIAZEM HCL ER COATED BEADS 240 MG PO CP24: 240.0000 mg | ORAL_CAPSULE | Freq: Every day | ORAL | 11 refills | Status: DC

## 2016-07-24 NOTE — Progress Notes (Signed)
Cardiology Office Note   Date:  07/24/2016   ID:  Shannon, Erin 1927-01-18, MRN [id number]  PCP:  Erin Pink, MD  Cardiologist:   Erin Sacramento, MD   Chief Complaint  Patient presents with  . Other    12 month follow up. Meds reviewed by the patient verbally. "doing well." Pt. needs a clearance to start on pain medication for right knee pain; not a candidate for knee replacement per Dr. Amada Jupiter.       History of Present Illness: Erin Shannon is a 82 y.o. female who presents for a follow-up visit regarding palpitations thought to be due to PVCs.  She was treated with diltiazem for both that and hypertension. She has done well but suffers from advanced dementia. She denies any chest pain, shortness of breath, palpitations or dizziness.  She continues to live by herself but gets support from her kids.  Biggest issue is severe knee arthritis. Given her age and dementia, she is not a good candidate for knee replacement.  Past Medical History:  Diagnosis Date  . Arrhythmia   . Breast cancer [facility])    s/p mastectomy  . Diastolic dysfunction    echo 11/07. EF 60-65%. mild to moderate PI  . GERD (gastroesophageal reflux disease)   . HTN (hypertension)   . Hyperlipidemia   . Memory loss   . Osteoarthritis (arthritis due to wear and tear of joints)   . Palpitations    monitor with PACs/PVCs. neg myoview 2007  . Syncope and collapse     Past Surgical History:  Procedure Laterality Date  . hysterectomy-unspecified area    . masectomy     R     Current Outpatient Prescriptions  Medication Sig Dispense Refill  . alendronate (FOSAMAX) 70 MG tablet Take 70 mg by mouth once a week. Take with a full glass of water on an empty stomach.    . Calcium Citrate-Vitamin D (CALCIUM CITRATE + D PO) Take 1 tablet by mouth daily. 630mg /500     . CARTIA XT 240 MG 24 hr capsule TAKE ONE CAPSULE BY MOUTH ONCE DAILY 90 capsule 2  . celecoxib (CELEBREX) 200 MG capsule Take 200 mg by  mouth daily.    Marland Kitchen donepezil (ARICEPT) 10 MG tablet Take 10 mg by mouth daily.      . memantine (NAMENDA) 10 MG tablet Take 10 mg by mouth 2 (two) times daily.      . Multiple Vitamins-Minerals (COMPLETE WOMENS) TABS Take 1 tablet by mouth daily.      . pravastatin (PRAVACHOL) 40 MG tablet Take 40 mg by mouth daily.       No current facility-administered medications for this visit.     Allergies:   Review of patient's allergies indicates no known allergies.    Social History:  The patient  reports that she has never smoked. She has never used smokeless tobacco. She reports that she does not drink alcohol or use drugs.   Family History:  The patient's family history includes Heart disease in her brother; Heart failure in her father.    ROS:  Please see the history of present illness.   Otherwise, review of systems are positive for none.   All other systems are reviewed and negative.    PHYSICAL EXAM: VS:  BP 140/78 (BP Location: Left Arm, Patient Position: Sitting, Cuff Size: Normal)   Pulse 65   Ht 4\' 9"  (1.448 m)   Wt 135 lb 12  oz (61.6 kg)   BMI 29.38 kg/m  , BMI Body mass index is 29.38 kg/m. GEN: Well nourished, well developed, in no acute distress  HEENT: normal  Neck: no JVD, carotid bruits, or masses Cardiac: RRR; no murmurs, rubs, or gallops,no edema  Respiratory:  clear to auscultation bilaterally, normal work of breathing GI: soft, nontender, nondistended, + BS MS: no deformity or atrophy  Skin: warm and dry, no rash Neuro:  Strength and sensation are intact Psych: euthymic mood, full affect   EKG:  EKG is ordered today. The ekg ordered today demonstrates normal sinus rhythm with first degree AV block. Possible old anterolateral infarct.   Recent Labs: No results found for requested labs within last 8760 hours.    Lipid Panel No results found for: CHOL, TRIG, HDL, CHOLHDL, VLDL, LDLCALC, LDLDIRECT    Wt Readings from Last 3 Encounters:  07/24/16 135 lb 12  oz (61.6 kg)  07/29/15 137 lb 8 oz (62.4 kg)  07/27/14 137 lb 4 oz (62.3 kg)     No flowsheet data found.    ASSESSMENT AND PLAN:  1.  PVCs: Well-controlled with diltiazem with no evidence of arrhythmia. There is no contraindication for using pain medications to treat her severe arthritis from a cardiac standpoint.  2. Essential hypertension: Blood pressure is reasonably controlled with diltiazem.    Disposition:   FU with me in 1 year  Signed,  Erin Sacramento, MD  07/24/2016 2:34 PM    [place]

## 2016-07-24 NOTE — Patient Instructions (Signed)
Medication Instructions:  Your physician recommends that you continue on your current medications as directed. Please refer to the Current Medication list given to you today.   Labwork: none  Testing/Procedures: none  Follow-Up: Your physician wants you to follow-up in: one year with Dr. Arida.  You will receive a reminder letter in the mail two months in advance. If you don't receive a letter, please call our office to schedule the follow-up appointment.   Any Other Special Instructions Will Be Listed Below (If Applicable).     If you need a refill on your cardiac medications before your next appointment, please call your pharmacy.   

## 2016-12-23 ENCOUNTER — Emergency Department: Payer: Medicare Other

## 2016-12-23 ENCOUNTER — Observation Stay
Admission: EM | Admit: 2016-12-23 | Discharge: 2016-12-25 | Disposition: A | Discharge status: Home / Self Care | Payer: Medicare Other | Attending: Internal Medicine | Admitting: Internal Medicine

## 2016-12-23 DIAGNOSIS — R7989 Other specified abnormal findings of blood chemistry: Secondary | ICD-10-CM

## 2016-12-23 DIAGNOSIS — R2681 Unsteadiness on feet: Secondary | ICD-10-CM

## 2016-12-23 DIAGNOSIS — D72829 Elevated white blood cell count, unspecified: Secondary | ICD-10-CM | POA: Diagnosis not present

## 2016-12-23 DIAGNOSIS — F0390 Unspecified dementia without behavioral disturbance: Secondary | ICD-10-CM | POA: Diagnosis not present

## 2016-12-23 DIAGNOSIS — R778 Other specified abnormalities of plasma proteins: Secondary | ICD-10-CM | POA: Diagnosis not present

## 2016-12-23 DIAGNOSIS — N179 Acute kidney failure, unspecified: Secondary | ICD-10-CM | POA: Diagnosis present

## 2016-12-23 DIAGNOSIS — Z7983 Long term (current) use of bisphosphonates: Secondary | ICD-10-CM | POA: Diagnosis not present

## 2016-12-23 DIAGNOSIS — E785 Hyperlipidemia, unspecified: Secondary | ICD-10-CM | POA: Insufficient documentation

## 2016-12-23 DIAGNOSIS — R531 Weakness: Secondary | ICD-10-CM

## 2016-12-23 DIAGNOSIS — Z79899 Other long term (current) drug therapy: Secondary | ICD-10-CM | POA: Insufficient documentation

## 2016-12-23 DIAGNOSIS — I10 Essential (primary) hypertension: Secondary | ICD-10-CM | POA: Diagnosis not present

## 2016-12-23 DIAGNOSIS — I2489 Other forms of acute ischemic heart disease: Secondary | ICD-10-CM

## 2016-12-23 DIAGNOSIS — Z791 Long term (current) use of non-steroidal anti-inflammatories (NSAID): Secondary | ICD-10-CM | POA: Insufficient documentation

## 2016-12-23 DIAGNOSIS — B9620 Unspecified Escherichia coli [E. coli] as the cause of diseases classified elsewhere: Secondary | ICD-10-CM | POA: Diagnosis not present

## 2016-12-23 DIAGNOSIS — E860 Dehydration: Secondary | ICD-10-CM | POA: Diagnosis not present

## 2016-12-23 DIAGNOSIS — M6281 Muscle weakness (generalized): Secondary | ICD-10-CM

## 2016-12-23 DIAGNOSIS — I441 Atrioventricular block, second degree: Secondary | ICD-10-CM | POA: Diagnosis not present

## 2016-12-23 DIAGNOSIS — R296 Repeated falls: Secondary | ICD-10-CM | POA: Insufficient documentation

## 2016-12-23 DIAGNOSIS — N390 Urinary tract infection, site not specified: Secondary | ICD-10-CM | POA: Diagnosis not present

## 2016-12-23 DIAGNOSIS — I248 Other forms of acute ischemic heart disease: Secondary | ICD-10-CM

## 2016-12-23 DIAGNOSIS — N289 Disorder of kidney and ureter, unspecified: Secondary | ICD-10-CM

## 2016-12-23 LAB — HEPATIC FUNCTION PANEL
ALT: 28 U/L (ref 14–54)
AST: 72 U/L — AB (ref 15–41)
Albumin: 3.4 g/dL — ABNORMAL LOW (ref 3.5–5.0)
Alkaline Phosphatase: 57 U/L (ref 38–126)
BILIRUBIN DIRECT: 0.2 mg/dL (ref 0.1–0.5)
Indirect Bilirubin: 0.6 mg/dL (ref 0.3–0.9)
Total Bilirubin: 0.8 mg/dL (ref 0.3–1.2)
Total Protein: 7 g/dL (ref 6.5–8.1)

## 2016-12-23 LAB — INFLUENZA PANEL BY PCR (TYPE A & B)
INFLAPCR: NEGATIVE
INFLBPCR: NEGATIVE

## 2016-12-23 LAB — BRAIN NATRIURETIC PEPTIDE: B Natriuretic Peptide: 74 pg/mL (ref 0.0–100.0)

## 2016-12-23 LAB — BASIC METABOLIC PANEL
ANION GAP: 9 (ref 5–15)
BUN: 48 mg/dL — ABNORMAL HIGH (ref 6–20)
CALCIUM: 9.3 mg/dL (ref 8.9–10.3)
CO2: 28 mmol/L (ref 22–32)
Chloride: 100 mmol/L — ABNORMAL LOW (ref 101–111)
Creatinine, Ser: 1.57 mg/dL — ABNORMAL HIGH (ref 0.44–1.00)
GFR, EST AFRICAN AMERICAN: 33 mL/min — AB (ref >60)
GFR, EST NON AFRICAN AMERICAN: 29 mL/min — AB (ref >60)
GLUCOSE: 210 mg/dL — AB (ref 65–99)
Potassium: 3.6 mmol/L (ref 3.5–5.1)
SODIUM: 137 mmol/L (ref 135–145)

## 2016-12-23 LAB — CBC
HCT: 40.3 % (ref 35.0–47.0)
HEMOGLOBIN: 13.9 g/dL (ref 12.0–16.0)
MCH: 31.3 pg (ref 26.0–34.0)
MCHC: 34.5 g/dL (ref 32.0–36.0)
MCV: 90.7 fL (ref 80.0–100.0)
Platelets: 249 10*3/uL (ref 150–440)
RBC: 4.44 MIL/uL (ref 3.80–5.20)
RDW: 13.7 % (ref 11.5–14.5)
WBC: 17.6 10*3/uL — AB (ref 3.6–11.0)

## 2016-12-23 LAB — URINALYSIS, COMPLETE (UACMP) WITH MICROSCOPIC
BILIRUBIN URINE: NEGATIVE
Glucose, UA: NEGATIVE mg/dL
Hgb urine dipstick: NEGATIVE
Ketones, ur: 5 mg/dL — AB
Nitrite: NEGATIVE
PH: 5 (ref 5.0–8.0)
Protein, ur: 30 mg/dL — AB
SPECIFIC GRAVITY, URINE: 1.017 (ref 1.005–1.030)
Squamous Epithelial / LPF: NONE SEEN

## 2016-12-23 LAB — TROPONIN I
TROPONIN I: 0.05 ng/mL — AB (ref <0.03)
Troponin I: 0.05 ng/mL (ref <0.03)

## 2016-12-23 MED ORDER — ENOXAPARIN SODIUM 40 MG/0.4ML ~~LOC~~ SOLN: 40.0000 mg | SUBCUTANEOUS | Status: DC

## 2016-12-23 MED ORDER — ASPIRIN 81 MG PO CHEW
324.0000 mg | CHEWABLE_TABLET | Freq: Once | ORAL | Status: AC
  Administered 2016-12-23: 324 mg via ORAL
  Filled 2016-12-23: qty 4

## 2016-12-23 MED ORDER — ALBUTEROL SULFATE (2.5 MG/3ML) 0.083% IN NEBU: 2.5000 mg | INHALATION_SOLUTION | RESPIRATORY_TRACT | Status: DC | PRN

## 2016-12-23 MED ORDER — DONEPEZIL HCL 5 MG PO TABS
10.0000 mg | ORAL_TABLET | Freq: Every day | ORAL | Status: DC
  Administered 2016-12-24 – 2016-12-25 (×2): 10 mg via ORAL
  Filled 2016-12-23 (×2): qty 2

## 2016-12-23 MED ORDER — PRAVASTATIN SODIUM 40 MG PO TABS
40.0000 mg | ORAL_TABLET | Freq: Every day | ORAL | Status: DC
  Administered 2016-12-23 – 2016-12-25 (×3): 40 mg via ORAL
  Filled 2016-12-23 (×3): qty 1

## 2016-12-23 MED ORDER — SODIUM CHLORIDE 0.9 % IV BOLUS (SEPSIS)
1000.0000 mL | Freq: Once | INTRAVENOUS | Status: AC
Start: 2016-12-23 — End: 2016-12-23
  Administered 2016-12-23: 1000 mL via INTRAVENOUS

## 2016-12-23 MED ORDER — CEFTRIAXONE SODIUM-DEXTROSE 1-3.74 GM-% IV SOLR
1.0000 g | INTRAVENOUS | Status: DC
  Administered 2016-12-23 – 2016-12-24 (×2): 1 g via INTRAVENOUS
  Filled 2016-12-23 (×3): qty 50

## 2016-12-23 MED ORDER — ONDANSETRON HCL 4 MG/2ML IJ SOLN: 4.0000 mg | Freq: Four times a day (QID) | INTRAMUSCULAR | Status: DC | PRN

## 2016-12-23 MED ORDER — ACETAMINOPHEN 325 MG PO TABS: 650.0000 mg | ORAL_TABLET | Freq: Four times a day (QID) | ORAL | Status: DC | PRN

## 2016-12-23 MED ORDER — DEXTROSE 5 % IV SOLN: 1.0000 g | INTRAVENOUS | Status: DC

## 2016-12-23 MED ORDER — SENNOSIDES-DOCUSATE SODIUM 8.6-50 MG PO TABS: 1.0000 | ORAL_TABLET | Freq: Every evening | ORAL | Status: DC | PRN

## 2016-12-23 MED ORDER — ENOXAPARIN SODIUM 30 MG/0.3ML ~~LOC~~ SOLN
30.0000 mg | SUBCUTANEOUS | Status: DC
  Administered 2016-12-24: 30 mg via SUBCUTANEOUS
  Filled 2016-12-23: qty 0.3

## 2016-12-23 MED ORDER — ONDANSETRON HCL 4 MG PO TABS: 4.0000 mg | ORAL_TABLET | Freq: Four times a day (QID) | ORAL | Status: DC | PRN

## 2016-12-23 MED ORDER — BISACODYL 5 MG PO TBEC: 5.0000 mg | DELAYED_RELEASE_TABLET | Freq: Every day | ORAL | Status: DC | PRN

## 2016-12-23 MED ORDER — PRAVASTATIN SODIUM 40 MG PO TABS: 40.0000 mg | ORAL_TABLET | Freq: Every day | ORAL | Status: DC

## 2016-12-23 MED ORDER — ASPIRIN 325 MG PO TABS
325.0000 mg | ORAL_TABLET | Freq: Every day | ORAL | Status: DC
  Administered 2016-12-24: 325 mg via ORAL
  Filled 2016-12-23: qty 1

## 2016-12-23 MED ORDER — ACETAMINOPHEN 650 MG RE SUPP: 650.0000 mg | Freq: Four times a day (QID) | RECTAL | Status: DC | PRN

## 2016-12-23 MED ORDER — MEMANTINE HCL 5 MG PO TABS
10.0000 mg | ORAL_TABLET | Freq: Two times a day (BID) | ORAL | Status: DC
  Administered 2016-12-23 – 2016-12-25 (×4): 10 mg via ORAL
  Filled 2016-12-23 (×4): qty 2

## 2016-12-23 MED ORDER — SODIUM CHLORIDE 0.9 % IV SOLN
INTRAVENOUS | Status: DC
  Administered 2016-12-23: 23:00:00 via INTRAVENOUS

## 2016-12-23 NOTE — H&P (Addendum)
[place] [facility] [place] NAME: Erin Shannon    MR#:  [id number]  DATE OF BIRTH:  1927-01-26  DATE OF ADMISSION:  12/23/2016  PRIMARY CARE PHYSICIAN: [name], MD   REQUESTING/REFERRING PHYSICIAN: Eula Listen, MD  CHIEF COMPLAINT:   Chief Complaint  Patient presents with  . Weakness   Fall multiple times for the past week. HISTORY OF PRESENT ILLNESS:  Erin Shannon  is a 83 y.o. female with a known history of Hypertension, breast cancer, hyperlipidemia and GERD. The patient was sent to ED due to multiple falls at home. According to her daughter she had poor oral intake and generalized weakness. She had incontinence at baseline. But the patient denies any symptoms. She denies any injuries from her falls. She was found to dehydration and leukocytosis. Chest x-ray didn't show any infiltrate. Urinalysis showed too numerous WBC. She has elevated troponin but denies any chest pain. She was treated with aspirin in the ED.  PAST MEDICAL HISTORY:   Past Medical History:  Diagnosis Date  . Arrhythmia   . Breast cancer [facility])    s/p mastectomy  . Diastolic dysfunction    echo 11/07. EF 60-65%. mild to moderate PI  . GERD (gastroesophageal reflux disease)   . HTN (hypertension)   . Hyperlipidemia   . Memory loss   . Osteoarthritis (arthritis due to wear and tear of joints)   . Palpitations    monitor with PACs/PVCs. neg myoview 2007  . Syncope and collapse     PAST SURGICAL HISTORY:   Past Surgical History:  Procedure Laterality Date  . hysterectomy-unspecified area    . masectomy     R    SOCIAL HISTORY:   Social History  Substance Use Topics  . Smoking status: Never Smoker  . Smokeless tobacco: Never Used     Comment: tobacco use -no  . Alcohol use No    FAMILY HISTORY:   Family History  Problem Relation Age of Onset  . Heart failure Father   . Heart disease Brother     DRUG ALLERGIES:  No Known  Allergies  REVIEW OF SYSTEMS:   Review of Systems  Constitutional: Negative for chills, fever and malaise/fatigue.  HENT: Negative for congestion and sore throat.   Eyes: Negative for blurred vision and double vision.  Respiratory: Negative for cough and shortness of breath.   Cardiovascular: Negative for chest pain and leg swelling.  Gastrointestinal: Negative for abdominal pain, blood in stool, diarrhea, melena, nausea and vomiting.  Genitourinary: Negative for dysuria and frequency.       Incontinence  Musculoskeletal: Negative for back pain and joint pain.  Skin: Negative for itching and rash.  Neurological: Negative for dizziness, focal weakness, loss of consciousness, weakness and headaches.  Psychiatric/Behavioral: Negative for depression. The patient is not nervous/anxious.     MEDICATIONS AT HOME:   Prior to Admission medications   Medication Sig Start Date End Date Taking? Authorizing Provider  alendronate (FOSAMAX) 70 MG tablet Take 70 mg by mouth once a week. Take with a full glass of water on an empty stomach.   Yes Historical Provider, MD  Calcium Citrate-Vitamin D (CALCIUM CITRATE + D PO) Take 1 tablet by mouth daily. 630mg /500    Yes Historical Provider, MD  diltiazem (CARTIA XT) 240 MG 24 hr capsule Take 1 capsule (240 mg total) by mouth daily. 07/24/16  Yes Wellington Hampshire, MD  donepezil (ARICEPT) 10 MG tablet Take 10 mg  by mouth daily.     Yes Historical Provider, MD  memantine (NAMENDA) 10 MG tablet Take 10 mg by mouth 2 (two) times daily.     Yes Historical Provider, MD  Multiple Vitamins-Minerals (COMPLETE WOMENS) TABS Take 1 tablet by mouth daily.     Yes Historical Provider, MD  pravastatin (PRAVACHOL) 40 MG tablet Take 40 mg by mouth daily.     Yes Historical Provider, MD  traMADol (ULTRAM) 50 MG tablet Take 50 mg by mouth every 6 (six) hours as needed.   Yes Historical Provider, MD  celecoxib (CELEBREX) 200 MG capsule Take 200 mg by mouth daily.    Historical  Provider, MD      VITAL SIGNS:  Blood pressure (!) 110/50, pulse 86, temperature 97.6 F (36.4 C), temperature source Oral, resp. rate 18, weight 135 lb (61.2 kg), SpO2 94 %.  PHYSICAL EXAMINATION:  Physical Exam  GENERAL:  83 y.o.-year-old patient lying in the bed with no acute distress.  EYES: Pupils equal, round, reactive to light and accommodation. No scleral icterus. Extraocular muscles intact.  HEENT: Head atraumatic, normocephalic. Oropharynx and nasopharynx clear. Dry oral mucosa. NECK:  Supple, no jugular venous distention. No thyroid enlargement, no tenderness.  LUNGS: Normal breath sounds bilaterally, no wheezing, rales,rhonchi or crepitation. No use of accessory muscles of respiration.  CARDIOVASCULAR: S1, S2 normal. No murmurs, rubs, or gallops.  ABDOMEN: Soft, nontender, nondistended. Bowel sounds present. No organomegaly or mass.  EXTREMITIES: No pedal edema, cyanosis, or clubbing.  NEUROLOGIC: Cranial nerves II through XII are intact. Muscle strength 3-4/5 in bilateral lower extremities. Sensation intact. Gait not checked.  PSYCHIATRIC: The patient is alert and oriented x 3.  SKIN: No obvious rash, lesion, or ulcer.   LABORATORY PANEL:   CBC  Recent Labs Lab 12/23/16 1538  WBC 17.6*  HGB 13.9  HCT 40.3  PLT 249   ------------------------------------------------------------------------------------------------------------------  Chemistries   Recent Labs Lab 12/23/16 1538  NA 137  K 3.6  CL 100*  CO2 28  GLUCOSE 210*  BUN 48*  CREATININE 1.57*  CALCIUM 9.3  AST 72*  ALT 28  ALKPHOS 57  BILITOT 0.8   ------------------------------------------------------------------------------------------------------------------  Cardiac Enzymes  Recent Labs Lab 12/23/16 1538  TROPONINI 0.05*   ------------------------------------------------------------------------------------------------------------------  RADIOLOGY:  Dg Chest 2 View  Result Date:  12/23/2016 CLINICAL DATA:  Patient fell. Increased weakness x5 days with fatigue. History of breast cancer and hypertension. EXAM: CHEST  2 VIEW COMPARISON:  09/05/2011 FINDINGS: Borderline cardiomegaly. Aorta is not aneurysmal. No pneumonic consolidation. No effusion, pneumothorax or CHF. Surgical clips project over the right axilla and right breast. Osteoarthritis of the glenohumeral joints left worse than right with joint space narrowing and spurring. Thoracolumbar spondylosis. No acute appearing osseous abnormality. IMPRESSION: No active cardiopulmonary disease. Electronically Signed   By: Ashley Royalty M.D.   On: 12/23/2016 17:59   Ct Head Wo Contrast  Result Date: 12/23/2016 CLINICAL DATA:  Weakness and fatigue.  Increased number of falls. EXAM: CT HEAD WITHOUT CONTRAST TECHNIQUE: Contiguous axial images were obtained from the base of the skull through the vertex without intravenous contrast. COMPARISON:  CT head without contrast 09/05/2011. FINDINGS: Brain: Remote lacunar infarcts involving the anterior limb of the right internal capsule are stable. Moderate atrophy and diffuse white matter disease demonstrate some progression, advanced for age. No acute cortical infarct is present. There is no hemorrhage or mass lesion. Ventricles are proportionate to the degree of atrophy. Vascular: Atherosclerotic calcifications are present in the cavernous internal  carotid arteries bilaterally. There is no hyperdense vessel. Skull: Calvarium is intact. No focal lytic or blastic lesions are present. Sinuses/Orbits: The paranasal sinuses and mastoid air cells are clear. The globes and orbits are within normal limits. IMPRESSION: 1. No acute intracranial abnormality. 2. Progression of moderate generalized atrophy and white matter disease. 3. Remote lacunar infarct involving the anterior limb of the right internal capsule. 4. Atherosclerosis without a hyperdense vessel. Electronically Signed   By: San Morelle M.D.    On: 12/23/2016 18:46      IMPRESSION AND PLAN:   Acute renal failure due to dehydration. The patient will be placed for observation. Start IV fluid support and follow-up BMP.  UTI with Leukocytosis,  Follow-up culture, start Rocephin IVI. Hypertension. Hold Cardizem due to low sacral pressure. Generalized weakness and fall. Fall precaution and PT evaluation.  All the records are reviewed and case discussed with ED provider. Management plans discussed with the patient, her daughter and they are in agreement.  CODE STATUS: Full code  TOTAL TIME TAKING CARE OF THIS PATIENT: 51 minutes.    Demetrios Loll M.D on 12/23/2016 at 9:06 PM  Between 7am to 6pm - Pager - [phone]  After 6pm go to [email or web address] - Proofreader  [facility]  Office  [phone]  CC: Primary care physician; [name], MD   Note: This dictation was prepared with Dragon dictation along with smaller phrase technology. Any transcriptional errors that result from this process are unintentional.

## 2016-12-23 NOTE — ED Notes (Signed)
Patient transported to CT 

## 2016-12-23 NOTE — ED Triage Notes (Addendum)
Weakness, fatigue X 5 days. Pt alert and oriented X4, active, cooperative, pt in NAD. RR even and unlabored, color WNL.  Increase in number of falls. Pt lives alone. Denies injuries from falls.

## 2016-12-23 NOTE — Progress Notes (Signed)
Arrival Method: via stretcher with ED NT Mental Orientation:?A&O x 2 Telemetry: No orders Skin:?Intact, Verified by Lenna Sciara, RN IV:?20g left forearm Pain:?no complaints of pain Tubes:?MIVF Safety Measures: Safety Fall Prevention Plan has been given, discussed & signed, non skid socks in place, patient has been orientated to the room, unit &staff.  Family:?Has been informed of plan of care and is at bedside. Orders have been reviewed & implemented. Will continue to monitor the patient. Call light has been placed within reach.

## 2016-12-23 NOTE — Progress Notes (Signed)
Lovenox changed to 30 mg daily for CrCl<30. 

## 2016-12-23 NOTE — ED Provider Notes (Signed)
[facility] Emergency Department Provider Note  ____________________________________________  Time seen: Approximately 5:11 PM  I have reviewed the triage vital signs and the nursing notes.   HISTORY  Chief Complaint Weakness    HPI Erin Shannon is a 83 y.o. female with a history of HTN, HL, CHF presenting with generalized weakness and recurrent falls. The patient reports that over the last several days she has been "weak all over" and has had a few falls because she couldn't stand. She denies any unilateral weakness, numbness or tingling, headache, visual changes, speech changes or mental status changes. No dysuria, nausea vomiting or diarrhea, cough or cold symptoms. No recent changes in her medications. She denies any injuries from her falls.In the past, the patient has had similar symptoms in the setting of UTI and dehydration. Also has new bilateral lower extremity edema over the past week.   Past Medical History:  Diagnosis Date  . Arrhythmia   . Breast cancer [facility])    s/p mastectomy  . Diastolic dysfunction    echo 11/07. EF 60-65%. mild to moderate PI  . GERD (gastroesophageal reflux disease)   . HTN (hypertension)   . Hyperlipidemia   . Memory loss   . Osteoarthritis (arthritis due to wear and tear of joints)   . Palpitations    monitor with PACs/PVCs. neg myoview 2007  . Syncope and collapse     Patient Active Problem List   Diagnosis Date Noted  . ARF (acute renal failure) ([place]) 12/23/2016  . Chronic venous insufficiency 07/27/2014  . BRADYCARDIA 12/19/2010  . DIASTOLIC HEART FAILURE, CHRONIC 12/19/2010  . HYPERTENSION, BENIGN 02/06/2009  . PALPITATIONS 02/06/2009    Past Surgical History:  Procedure Laterality Date  . hysterectomy-unspecified area    . masectomy     R      Allergies Patient has no known allergies.  Family History  Problem Relation Age of Onset  . Heart failure Father   . Heart disease Brother      Social History Social History  Substance Use Topics  . Smoking status: Never Smoker  . Smokeless tobacco: Never Used     Comment: tobacco use -no  . Alcohol use No    Review of Systems Constitutional: No fever/chills.Positive generalized weakness. No myalgias. No lightheadedness or syncope. No dizziness. Positive falls without trauma or loss of consciousness. Eyes: No visual changes. No blurred or double vision. ENT: No sore throat. No congestion or rhinorrhea. Cardiovascular: Denies chest pain. Denies palpitations. Respiratory: Denies shortness of breath.  No cough. Gastrointestinal: No abdominal pain.  No nausea, no vomiting.  No diarrhea.  No constipation. Genitourinary: Negative for dysuria. Musculoskeletal: Negative for back pain. Skin: Negative for rash. Neurological: Negative for headaches. No focal numbness, tingling or weakness. No dizziness.  10-point ROS otherwise negative.  ____________________________________________   PHYSICAL EXAM:  VITAL SIGNS: ED Triage Vitals [12/23/16 1539]  Enc Vitals Group     BP (!) 118/52     Pulse Rate 86     Resp 18     Temp 97.6 F (36.4 C)     Temp Source Oral     SpO2 94 %     Weight 135 lb (61.2 kg)     Height      Head Circumference      Peak Flow      Pain Score      Pain Loc      Pain Edu?      Excl. in [place]?  Constitutional: Alert and oriented. Well appearing and in no acute distress. Answers questions appropriately. Eyes: Conjunctivae are normal.  EOMI. PERRLA. No scleral icterus. Head: Atraumatic. No raccoon eyes or Battle sign. Nose: No congestion/rhinnorhea. No swelling over the nose or septal hematoma. Mouth/Throat: Mucous membranes are moist. No dental injury or malocclusion. Neck: No stridor.  Supple.  Full range of motion without pain. No midline C-spine tenderness to palpation, step-offs or deformities. Cardiovascular: Normal rate, regular rhythm. No murmurs, rubs or gallops.  Respiratory: Normal  respiratory effort.  No accessory muscle use or retractions. Lungs CTAB.  No wheezes, rales or ronchi. Gastrointestinal: Soft, nontender and nondistended.  No guarding or rebound.  No peritoneal signs. Musculoskeletal: . Pelvis is stable. Positive symmetric bilateral lower extremity edema that is nonpitting to the mid tibial shaft. No calf tenderness to palpation or palpable cords. Neurologic:  A&Ox3.  Speech is clear.  Face and smile are symmetric.  EOMI.  Moves all extremities well. Skin:  Skin is warm, dry and intact. No rash noted. Psychiatric: Mood and affect are normal. Speech and behavior are normal.  Normal judgement.  ____________________________________________   LABS (all labs ordered are listed, but only abnormal results are displayed)  Labs Reviewed  BASIC METABOLIC PANEL - Abnormal; Notable for the following:       Result Value   Chloride 100 (*)    Glucose, Bld 210 (*)    BUN 48 (*)    Creatinine, Ser 1.57 (*)    GFR calc non Af Amer 29 (*)    GFR calc Af Amer 33 (*)    All other components within normal limits  CBC - Abnormal; Notable for the following:    WBC 17.6 (*)    All other components within normal limits  HEPATIC FUNCTION PANEL - Abnormal; Notable for the following:    Albumin 3.4 (*)    AST 72 (*)    All other components within normal limits  TROPONIN I - Abnormal; Notable for the following:    Troponin I 0.05 (*)    All other components within normal limits  URINALYSIS, COMPLETE (UACMP) WITH MICROSCOPIC - Abnormal; Notable for the following:    Color, Urine AMBER (*)    APPearance CLOUDY (*)    Ketones, ur 5 (*)    Protein, ur 30 (*)    Leukocytes, UA MODERATE (*)    Bacteria, UA MANY (*)    All other components within normal limits  TROPONIN I - Abnormal; Notable for the following:    Troponin I 0.05 (*)    All other components within normal limits  URINE CULTURE  INFLUENZA PANEL BY PCR (TYPE A & B)  BRAIN NATRIURETIC PEPTIDE  BASIC METABOLIC  PANEL  CBC  TROPONIN I  CBG MONITORING, ED   ____________________________________________  EKG  ED ECG REPORT I, Eula Listen, the attending physician, personally viewed and interpreted this ECG.   Date: 12/23/2016  EKG Time: 1543  Rate: 87  Rhythm: normal sinus rhythm  Axis: leftward  Intervals:first-degree A-V block   ST&T Change: Poor baseline tracing but no evidence of ST elevation. Positive PAC.  ____________________________________________  RADIOLOGY  Dg Chest 2 View  Result Date: 12/23/2016 CLINICAL DATA:  Patient fell. Increased weakness x5 days with fatigue. History of breast cancer and hypertension. EXAM: CHEST  2 VIEW COMPARISON:  09/05/2011 FINDINGS: Borderline cardiomegaly. Aorta is not aneurysmal. No pneumonic consolidation. No effusion, pneumothorax or CHF. Surgical clips project over the right axilla and right breast. Osteoarthritis  of the glenohumeral joints left worse than right with joint space narrowing and spurring. Thoracolumbar spondylosis. No acute appearing osseous abnormality. IMPRESSION: No active cardiopulmonary disease. Electronically Signed   By: Ashley Royalty M.D.   On: 12/23/2016 17:59   Ct Head Wo Contrast  Result Date: 12/23/2016 CLINICAL DATA:  Weakness and fatigue.  Increased number of falls. EXAM: CT HEAD WITHOUT CONTRAST TECHNIQUE: Contiguous axial images were obtained from the base of the skull through the vertex without intravenous contrast. COMPARISON:  CT head without contrast 09/05/2011. FINDINGS: Brain: Remote lacunar infarcts involving the anterior limb of the right internal capsule are stable. Moderate atrophy and diffuse white matter disease demonstrate some progression, advanced for age. No acute cortical infarct is present. There is no hemorrhage or mass lesion. Ventricles are proportionate to the degree of atrophy. Vascular: Atherosclerotic calcifications are present in the cavernous internal carotid arteries bilaterally. There is  no hyperdense vessel. Skull: Calvarium is intact. No focal lytic or blastic lesions are present. Sinuses/Orbits: The paranasal sinuses and mastoid air cells are clear. The globes and orbits are within normal limits. IMPRESSION: 1. No acute intracranial abnormality. 2. Progression of moderate generalized atrophy and white matter disease. 3. Remote lacunar infarct involving the anterior limb of the right internal capsule. 4. Atherosclerosis without a hyperdense vessel. Electronically Signed   By: San Morelle M.D.   On: 12/23/2016 18:46    ____________________________________________   PROCEDURES  Procedure(s) performed: None  Procedures  Critical Care performed: No ____________________________________________   INITIAL IMPRESSION / ASSESSMENT AND PLAN / ED COURSE  Pertinent labs & imaging results that were available during my care of the patient were reviewed by me and considered in my medical decision making (see chart for details).  83 y.o. female presenting with jaundice weakness and also without trauma. The patient also has lower shotty edema. I will evaluate the patient for UTI, congestive heart failure, electrolyte imbalance, anemia, arrhythmia, or atypical ACS or MI. She has no focal neurologic deficits on examination, will get CT scan of the head. We'll also get a chest x-ray.  ----------------------------------------- 5:18 PM on 12/23/2016 -----------------------------------------  The patient does have a mild elevation in her creatinine, which is suggestive of some dehydration. She'll receive intravenous fluids, as I am waiting the results of the remainder of her tests.  ____________________________________________  FINAL CLINICAL IMPRESSION(S) / ED DIAGNOSES  Final diagnoses:  Generalized weakness  Recurrent falls  Renal insufficiency  Elevated troponin         NEW MEDICATIONS STARTED DURING THIS VISIT:  Current Discharge Medication List         Eula Listen, MD 12/23/16 2352

## 2016-12-23 NOTE — ED Notes (Signed)
Patient transported to X-ray 

## 2016-12-24 DIAGNOSIS — I248 Other forms of acute ischemic heart disease: Secondary | ICD-10-CM | POA: Diagnosis not present

## 2016-12-24 DIAGNOSIS — R531 Weakness: Secondary | ICD-10-CM

## 2016-12-24 DIAGNOSIS — I441 Atrioventricular block, second degree: Secondary | ICD-10-CM

## 2016-12-24 DIAGNOSIS — N390 Urinary tract infection, site not specified: Secondary | ICD-10-CM

## 2016-12-24 LAB — BASIC METABOLIC PANEL
ANION GAP: 7 (ref 5–15)
BUN: 37 mg/dL — ABNORMAL HIGH (ref 6–20)
CALCIUM: 8.1 mg/dL — AB (ref 8.9–10.3)
CO2: 26 mmol/L (ref 22–32)
Chloride: 108 mmol/L (ref 101–111)
Creatinine, Ser: 1.12 mg/dL — ABNORMAL HIGH (ref 0.44–1.00)
GFR, EST AFRICAN AMERICAN: 50 mL/min — AB (ref >60)
GFR, EST NON AFRICAN AMERICAN: 43 mL/min — AB (ref >60)
Glucose, Bld: 75 mg/dL (ref 65–99)
Potassium: 3.3 mmol/L — ABNORMAL LOW (ref 3.5–5.1)
SODIUM: 141 mmol/L (ref 135–145)

## 2016-12-24 LAB — CBC
HCT: 35.1 % (ref 35.0–47.0)
HEMOGLOBIN: 12.1 g/dL (ref 12.0–16.0)
MCH: 31.6 pg (ref 26.0–34.0)
MCHC: 34.5 g/dL (ref 32.0–36.0)
MCV: 91.5 fL (ref 80.0–100.0)
Platelets: 225 10*3/uL (ref 150–440)
RBC: 3.83 MIL/uL (ref 3.80–5.20)
RDW: 13.5 % (ref 11.5–14.5)
WBC: 11.9 10*3/uL — AB (ref 3.6–11.0)

## 2016-12-24 LAB — TROPONIN I: TROPONIN I: 0.05 ng/mL — AB (ref <0.03)

## 2016-12-24 LAB — MAGNESIUM: MAGNESIUM: 2.1 mg/dL (ref 1.7–2.4)

## 2016-12-24 LAB — TSH: TSH: 1.374 u[IU]/mL (ref 0.350–4.500)

## 2016-12-24 MED ORDER — AMLODIPINE BESYLATE 5 MG PO TABS
5.0000 mg | ORAL_TABLET | Freq: Every day | ORAL | Status: DC
  Administered 2016-12-24 – 2016-12-25 (×2): 5 mg via ORAL
  Filled 2016-12-24 (×2): qty 1

## 2016-12-24 MED ORDER — ENSURE ENLIVE PO LIQD
237.0000 mL | Freq: Two times a day (BID) | ORAL | Status: DC
  Administered 2016-12-24 – 2016-12-25 (×3): 237 mL via ORAL

## 2016-12-24 MED ORDER — CIPROFLOXACIN HCL 500 MG PO TABS: 500.0000 mg | ORAL_TABLET | Freq: Two times a day (BID) | ORAL | 0 refills | Status: DC

## 2016-12-24 MED ORDER — POTASSIUM CHLORIDE CRYS ER 20 MEQ PO TBCR
40.0000 meq | EXTENDED_RELEASE_TABLET | ORAL | Status: AC
  Administered 2016-12-24 (×2): 40 meq via ORAL
  Filled 2016-12-24 (×2): qty 2

## 2016-12-24 MED ORDER — SODIUM CHLORIDE 0.9 % IV SOLN
INTRAVENOUS | Status: DC
  Administered 2016-12-24: via INTRAVENOUS

## 2016-12-24 MED ORDER — ASPIRIN EC 81 MG PO TBEC
81.0000 mg | DELAYED_RELEASE_TABLET | Freq: Every day | ORAL | Status: DC
  Administered 2016-12-25: 81 mg via ORAL
  Filled 2016-12-24: qty 1

## 2016-12-24 MED ORDER — DILTIAZEM HCL ER COATED BEADS 180 MG PO CP24: 180.0000 mg | ORAL_CAPSULE | Freq: Every day | ORAL | Status: DC

## 2016-12-24 NOTE — Progress Notes (Signed)
Initial Nutrition Assessment  DOCUMENTATION CODES:   Severe malnutrition in context of chronic illness  INTERVENTION:  1. Ensure Enlive po BID, each supplement provides 350 kcal and 20 grams of protein  NUTRITION DIAGNOSIS:   Malnutrition related to lethargy/confusion, chronic illness, other (see comment) (Progressing Dementia) as evidenced by severe depletion of muscle mass, moderate depletion of body fat, percent weight loss.  GOAL:   Patient will meet greater than or equal to 90% of their needs  MONITOR:   PO intake, I & O's, Labs, Weight trends, Supplement acceptance  REASON FOR ASSESSMENT:   Malnutrition Screening Tool    ASSESSMENT:   Erin Shannon  is a 83 y.o. female with a known history of Hypertension, breast cancer, hyperlipidemia and GERD. The patient was sent to ED due to multiple falls at home. According to her daughter she had poor oral intake and generalized weakness.  Spoke with patient at bedside.She provides appropriate answers to some questions, can't remember others. She reports she ate 100% of her breakfast and Lunch this morning but was unable to recall what she had. At home, states she ate well, good appetite, 3 meals per day. States they were not big meals, "I'm not a big eater," but eats until she is full. She reports drinking Ensure at home. Nutrition-Focused physical exam completed. Findings are moderate-severe fat depletion, moderate-severe muscle depletion, and no edema.  Documented meal completion: 50% thus far.  Per chart she exhibits a 25#/19% severe wt loss over 5 months.  Labs and medications reviewed: K 3.3 NS @ 49mL/hr  Diet Order:  Diet Heart Room service appropriate? Yes; Fluid consistency: Thin  Skin:  Reviewed, no issues  Last BM:  PTA  Height:   Ht Readings from Last 1 Encounters:  12/23/16 4\' 7"  (1.397 m)    Weight:   Wt Readings from Last 1 Encounters:  12/23/16 110 lb 6.4 oz (50.1 kg)    Ideal Body Weight:  29.54  kg  BMI:  Body mass index is 25.66 kg/m.  Estimated Nutritional Needs:   Kcal:  1250-1500 (25-30 cal/kg)  Protein:  50-60 gm  Fluid:  >/= 1.25L  EDUCATION NEEDS:   No education needs identified at this time  Satira Anis. Deanne Bedgood, MS, RD LDN Inpatient Clinical Dietitian Pager [phone]

## 2016-12-24 NOTE — Progress Notes (Signed)
Dr Jannifer Franklin notified that patient is going in and out of Second degree type I heart block, pt asympotmatic, VSS. No interventions at this time.

## 2016-12-24 NOTE — Consult Note (Signed)
Cardiology Consultation Note  Patient ID: Erin Shannon, MRN: [id number], DOB/AGE: 01-27-1927 83 y.o. Admit date: 12/23/2016   Date of Consult: 12/24/2016 Primary Physician: [name], MD Primary Cardiologist: Dr. Fletcher Anon, MD Requesting Physician: Dr. Bridgett Larsson, MD  Chief Complaint: Weakness found to have ARF and UTi Reason for Consult: Minimally elevated and flat trending troponin with a peak of 0.05 in the setting of the above  HPI: 83 y.o. female with h/o diastolic dysfunction, palpitations felt to be 2/2 PVCs treated with diltiazem, advanced dementia   Prior outpatient cardiac monitor with PACs and PVCs. In 2007 she had a negative Myoview. There is mention of numerous ED visits for falls in the H&P, unfortunately with chart biopsy I do not see any ED visits in Epic or Care Everywhere. Patient is unable to discuss the question of falls given her dementia. Patient reportedly has been feeling weak and has had a decreased appetite. She presented to [facility] 2/2 this. She denies any symptoms such as chest pain, SOB, palpitations, nausea, vomiting, dizziness, presyncope, or syncope. She was found to have ARF with SCr of 1.57, BUN 48, troponin of 0.05 x 3, K+ 3.6-->3.3, WBC 17.9-->11.9. CXR negative. CT head non-acute. Shew as found to have a UTI and has been started on Rocephin. Cardiology asked to evaluate the above troponin.     Past Medical History:  Diagnosis Date  . Arrhythmia   . Breast cancer [facility])    s/p mastectomy  . Diastolic dysfunction    echo 11/07. EF 60-65%. mild to moderate PI  . GERD (gastroesophageal reflux disease)   . HTN (hypertension)   . Hyperlipidemia   . Memory loss   . Osteoarthritis (arthritis due to wear and tear of joints)   . Palpitations    monitor with PACs/PVCs. neg myoview 2007  . Syncope and collapse       Most Recent Cardiac Studies: As above   Surgical History:  Past Surgical History:  Procedure Laterality Date  . hysterectomy-unspecified area    .  masectomy     R     Home Meds: Prior to Admission medications   Medication Sig Start Date End Date Taking? Authorizing Provider  alendronate (FOSAMAX) 70 MG tablet Take 70 mg by mouth once a week. Take with a full glass of water on an empty stomach.   Yes Historical Provider, MD  Calcium Citrate-Vitamin D (CALCIUM CITRATE + D PO) Take 1 tablet by mouth daily. 630mg /500    Yes Historical Provider, MD  diltiazem (CARTIA XT) 240 MG 24 hr capsule Take 1 capsule (240 mg total) by mouth daily. 07/24/16  Yes Wellington Hampshire, MD  donepezil (ARICEPT) 10 MG tablet Take 10 mg by mouth daily.     Yes Historical Provider, MD  memantine (NAMENDA) 10 MG tablet Take 10 mg by mouth 2 (two) times daily.     Yes Historical Provider, MD  Multiple Vitamins-Minerals (COMPLETE WOMENS) TABS Take 1 tablet by mouth daily.     Yes Historical Provider, MD  pravastatin (PRAVACHOL) 40 MG tablet Take 40 mg by mouth daily.     Yes Historical Provider, MD  traMADol (ULTRAM) 50 MG tablet Take 50 mg by mouth every 6 (six) hours as needed.   Yes Historical Provider, MD  celecoxib (CELEBREX) 200 MG capsule Take 200 mg by mouth daily.    Historical Provider, MD    Inpatient Medications:  . aspirin  325 mg Oral Daily  . cefTRIAXone  1 g Intravenous Q24H  .  donepezil  10 mg Oral Daily  . enoxaparin (LOVENOX) injection  30 mg Subcutaneous Q24H  . memantine  10 mg Oral BID  . potassium chloride  40 mEq Oral Q4H  . pravastatin  40 mg Oral Daily   . sodium chloride 75 mL/hr at 12/23/16 2255    Allergies: No Known Allergies  Social History   Social History  . Marital status: Widowed    Spouse name: N/A  . Number of children: N/A  . Years of education: N/A   Occupational History  . Not on file.   Social History Main Topics  . Smoking status: Never Smoker  . Smokeless tobacco: Never Used     Comment: tobacco use -no  . Alcohol use No  . Drug use: No  . Sexual activity: Not on file   Other Topics Concern  . Not  on file   Social History Narrative   Widowed, retired, gets regular exercise-works in yard.     Family History  Problem Relation Age of Onset  . Heart failure Father   . Heart disease Brother      Review of Systems: Review of Systems  Unable to perform ROS: Dementia   Labs:  Recent Labs  12/23/16 1538 12/23/16 2228 12/24/16 0507  TROPONINI 0.05* 0.05* 0.05*   Lab Results  Component Value Date   WBC 11.9 (H) 12/24/2016   HGB 12.1 12/24/2016   HCT 35.1 12/24/2016   MCV 91.5 12/24/2016   PLT 225 12/24/2016     Recent Labs Lab 12/23/16 1538 12/24/16 0507  NA 137 141  K 3.6 3.3*  CL 100* 108  CO2 28 26  BUN 48* 37*  CREATININE 1.57* 1.12*  CALCIUM 9.3 8.1*  PROT 7.0  --   BILITOT 0.8  --   ALKPHOS 57  --   ALT 28  --   AST 72*  --   GLUCOSE 210* 75   No results found for: CHOL, HDL, LDLCALC, TRIG No results found for: DDIMER  Radiology/Studies:  Dg Chest 2 View  Result Date: 12/23/2016 CLINICAL DATA:  Patient fell. Increased weakness x5 days with fatigue. History of breast cancer and hypertension. EXAM: CHEST  2 VIEW COMPARISON:  09/05/2011 FINDINGS: Borderline cardiomegaly. Aorta is not aneurysmal. No pneumonic consolidation. No effusion, pneumothorax or CHF. Surgical clips project over the right axilla and right breast. Osteoarthritis of the glenohumeral joints left worse than right with joint space narrowing and spurring. Thoracolumbar spondylosis. No acute appearing osseous abnormality. IMPRESSION: No active cardiopulmonary disease. Electronically Signed   By: Ashley Royalty M.D.   On: 12/23/2016 17:59   Ct Head Wo Contrast  Result Date: 12/23/2016 CLINICAL DATA:  Weakness and fatigue.  Increased number of falls. EXAM: CT HEAD WITHOUT CONTRAST TECHNIQUE: Contiguous axial images were obtained from the base of the skull through the vertex without intravenous contrast. COMPARISON:  CT head without contrast 09/05/2011. FINDINGS: Brain: Remote lacunar infarcts  involving the anterior limb of the right internal capsule are stable. Moderate atrophy and diffuse white matter disease demonstrate some progression, advanced for age. No acute cortical infarct is present. There is no hemorrhage or mass lesion. Ventricles are proportionate to the degree of atrophy. Vascular: Atherosclerotic calcifications are present in the cavernous internal carotid arteries bilaterally. There is no hyperdense vessel. Skull: Calvarium is intact. No focal lytic or blastic lesions are present. Sinuses/Orbits: The paranasal sinuses and mastoid air cells are clear. The globes and orbits are within normal limits. IMPRESSION: 1. No acute  intracranial abnormality. 2. Progression of moderate generalized atrophy and white matter disease. 3. Remote lacunar infarct involving the anterior limb of the right internal capsule. 4. Atherosclerosis without a hyperdense vessel. Electronically Signed   By: San Morelle M.D.   On: 12/23/2016 18:46    EKG: Interpreted by me showed: NSR, 87 bpm, left axis deviation, poor R wave progression, 1st degree AV block, rare PVC, nonspecific st/t changes Telemetry: Interpreted by me showed: Mobitz type I  Weights: Filed Weights   12/23/16 1539 12/23/16 2217  Weight: 135 lb (61.2 kg) 110 lb 6.4 oz (50.1 kg)     Physical Exam: Blood pressure (!) 131/55, pulse 81, temperature 98 F (36.7 C), temperature source Oral, resp. rate 19, height 4\' 7"  (1.397 m), weight 110 lb 6.4 oz (50.1 kg), SpO2 98 %. Body mass index is 25.66 kg/m. General: Frail appearing, in no acute distress. Head: Normocephalic, atraumatic, sclera non-icteric, no xanthomas, nares are without discharge.  Neck: Negative for carotid bruits. JVD not elevated. Lungs: Clear bilaterally to auscultation without wheezes, rales, or rhonchi. Breathing is unlabored. Heart: RRR with S1 S2. No murmurs, rubs, or gallops appreciated. Abdomen: Soft, non-tender, non-distended with normoactive bowel  sounds. No hepatomegaly. No rebound/guarding. No obvious abdominal masses. Msk:  Strength and tone appear normal for age. Extremities: No clubbing or cyanosis. No edema. Distal pedal pulses are 2+ and equal bilaterally. Neuro: Alert and oriented X 3. No facial asymmetry. No focal deficit. Moves all extremities spontaneously. Psych:  Responds to questions appropriately with a normal affect.    Assessment and Plan:  Principal Problem:   UTI (urinary tract infection) Active Problems:   ARF (acute renal failure) (HCC)   Weakness   Mobitz (type) I (Wenckebach's) atrioventricular block   Demand ischemia ([place])    1. Elevated troponin: -Minimally elevated and flat trending with a peak of [id number] -Almost certainly supply demand ischemia in the setting of the patient acute renal injury and UTI -No symptoms concerning for ACS at all -No indications for advanced testing At baseline, patient with advanced dementia and advanced age -No plans for further inpatient cardiac evaluation  2. ARF: -Likely 2/2 infection -Per IM  3. UTI: -Per IM  4. Mobitz type I: -Asymptomatic  -Replete potassium to goal of 4.0 -Check magnesium and tsh -Avoid AV nodal blocking agents  5. Advanced dementia: -Per IM   6. Weakness: -Likely 2/2 poor PO intake, dehydration, UTI, acute illness, electrolyte abnormalities, and deconditioning -As above   Signed, Christell Faith, PA-C [facility] Pager: [phone] 12/24/2016, 11:04 AM

## 2016-12-24 NOTE — Evaluation (Signed)
Physical Therapy Evaluation Patient Details Name: Erin Shannon MRN: [id number] DOB: [birth year] Today's Date: 12/24/2016   History of Present Illness  Pt is a 83 y/o F who presents after multiple falls at home as well as generalized weakness.  Pt found to have ARF and UTI.  Palpitations felt by Cardiology to be due to PVCs which pt is being treated for.  Pt's PMH includes breast cancer s/p mastectomy, memory loss, syncope and collapse, palpitations.    Clinical Impression  Pt admitted with above diagnosis. Pt currently with functional limitations due to the deficits listed below (see PT Problem List). Ms. Petroff presents with generalized weakness and requires min assist for bed mobility and mod assist for sit<>stand.  Pt unable to ambulate due to weakness and fear of falling.  Per chart review pt has had multiple falls at home and pt confirms this.  No family present to confirm PLOF or home layout information.  Given pt's current mobility status, recommending SNF at d/c.  Pt will benefit from skilled PT to increase their independence and safety with mobility to allow discharge to the venue listed below.      Follow Up Recommendations SNF;Supervision/Assistance - 24 hour    Equipment Recommendations  Other (comment) (TBD once family able to confirm equipment needs at home)    Recommendations for Other Services       Precautions / Restrictions Precautions Precautions: Fall Restrictions Weight Bearing Restrictions: No      Mobility  Bed Mobility Overal bed mobility: Needs Assistance Bed Mobility: Supine to Sit;Sit to Supine     Supine to sit: Min assist;HOB elevated Sit to supine: Min assist   General bed mobility comments: Min assist to elevate trunk as pt pulls on bed rail to achieve sitting. Min assist to use bed pad to scoot pt to EOB.  To return to bed, min assist to assist LEs into bed.  Transfers Overall transfer level: Needs assistance Equipment used: Rolling walker (2  wheeled);1 person hand held assist Transfers: Sit to/from Stand Sit to Stand: Mod assist         General transfer comment: Assist to boost to standing and to steady.  Pt stands slowly and demonstrates flexed posture which improves briefly and minimally with cues for upright posture.  Assist to control descent to sit on EOB.  Ambulation/Gait             General Gait Details: Attempted with RW and again with 1 person HHA; however pt unable to take a step saying, "I don't want to fall" and pt very unsteady on attempt.  Pt able to perform hip flexion while standing at bedside with mod assist to steady when flexing L hip.  Stairs            Wheelchair Mobility    Modified Rankin (Stroke Patients Only)       Balance Overall balance assessment: Needs assistance;History of Falls Sitting-balance support: No upper extremity supported;Feet supported Sitting balance-Leahy Scale: Poor Sitting balance - Comments: Relies on at least 1 UE supported while sitting EOB Postural control: Posterior lean Standing balance support: Bilateral upper extremity supported;During functional activity Standing balance-Leahy Scale: Poor Standing balance comment: Pt relies on UE support from either RW or PT and reaching out for table at bedside in static standing.                             Pertinent Vitals/Pain Pain Assessment: Faces  Faces Pain Scale: Hurts little more Pain Location: R shoulder with MMT of R shoulder flexion strength Pain Descriptors / Indicators: Grimacing Pain Intervention(s): Limited activity within patient's tolerance;Monitored during session    Home Living Family/patient expects to be discharged to:: Private residence     Type of Home: House Home Access: Stairs to enter Entrance Stairs-Rails:  ("I don't remember which side it is on") Entrance Stairs-Number of Steps: 4 Home Layout: Two level;Other (Comment) ("I don't remember if my room is upstairs or  downstairs") Home Equipment: Cane - single point Additional Comments: No family present to confirm or provide information regarind pt's PLOF or home layout    Prior Function Level of Independence: Needs assistance   Gait / Transfers Assistance Needed: Pt reports she was ambulating with a cane and "falling a lot"  ADL's / Homemaking Assistance Needed: Likely needs assist with ADLs        Hand Dominance        Extremity/Trunk Assessment   Upper Extremity Assessment Upper Extremity Assessment: RUE deficits/detail;LUE deficits/detail RUE Deficits / Details: shoulder flexion limited to ~80 deg which pt attributes to old age.  Pt reports pain in R shoulder with MMT testing.  Strength grossly 3/5 LUE Deficits / Details: shoulder flexion limited to ~80 deg which pt attributes to old age.  Strength grossly 3/5    Lower Extremity Assessment Lower Extremity Assessment: Generalized weakness;RLE deficits/detail;LLE deficits/detail RLE Deficits / Details: R knee flexion 3-/5, otherwise RLE strength grossly 3+/5 LLE Deficits / Details: Strength grossly 3+/5    Cervical / Trunk Assessment Cervical / Trunk Assessment: Kyphotic  Communication   Communication: No difficulties  Cognition Arousal/Alertness: Awake/alert Behavior During Therapy: WFL for tasks assessed/performed (very pleasant) Overall Cognitive Status: History of cognitive impairments - at baseline                      General Comments General comments (skin integrity, edema, etc.): HR up to 95 with sit<>stand and attempted ambulation    Exercises General Exercises - Upper Extremity Shoulder Flexion: AROM;Both;10 reps;Seated;Strengthening (pt denies pain with this) General Exercises - Lower Extremity Long Arc Quad: Strengthening;Both;10 reps;Seated;AROM   Assessment/Plan    PT Assessment Patient needs continued PT services  PT Problem List Decreased strength;Decreased activity tolerance;Decreased  balance;Decreased mobility;Decreased cognition;Decreased knowledge of use of DME;Decreased safety awareness;Pain          PT Treatment Interventions DME instruction;Gait training;Stair training;Functional mobility training;Therapeutic activities;Therapeutic exercise;Balance training;Neuromuscular re-education;Cognitive remediation;Patient/family education;Wheelchair mobility training    PT Goals (Current goals can be found in the Care Plan section)  Acute Rehab PT Goals Patient Stated Goal: to be able to walk PT Goal Formulation: With patient Time For Goal Achievement: 01/07/17 Potential to Achieve Goals: Fair    Frequency Min 2X/week   Barriers to discharge   No family present to confirm amount of assist available at d/c.    Co-evaluation               End of Session Equipment Utilized During Treatment: Gait belt Activity Tolerance: Patient limited by fatigue Patient left: in bed;with call bell/phone within reach;with bed alarm set Nurse Communication: Mobility status    Functional Assessment Tool Used: Clinical Judgement Functional Limitation: Mobility: Walking and moving around Mobility: Walking and Moving Around Current Status [id number]): At least 40 percent but less than 60 percent impaired, limited or restricted Mobility: Walking and Moving Around Goal Status [phone]): At least 20 percent but less than 40 percent impaired, limited  or restricted    Time: 1123-1138 PT Time Calculation (min) (ACUTE ONLY): 15 min   Charges:   PT Evaluation $PT Eval Low Complexity: 1 Procedure     PT G Codes:   PT G-Codes **NOT FOR INPATIENT CLASS** Functional Assessment Tool Used: Clinical Judgement Functional Limitation: Mobility: Walking and moving around Mobility: Walking and Moving Around Current Status [id number]): At least 40 percent but less than 60 percent impaired, limited or restricted Mobility: Walking and Moving Around Goal Status [id number]): At least 20 percent but less than  40 percent impaired, limited or restricted    Collie Siad PT, DPT 12/24/2016, 12:03 PM

## 2016-12-24 NOTE — Progress Notes (Signed)
[facility] [place] NAME: Erin Shannon    MR#:  [id number]  DATE OF BIRTH:  04/25/1927  SUBJECTIVE:  CHIEF COMPLAINT:   Chief Complaint  Patient presents with  . Weakness  Feels better. No dizziness. Work with physical therapy.  REVIEW OF SYSTEMS:    Review of Systems  Constitutional: Negative for chills and fever.  HENT: Negative for sore throat.   Eyes: Negative for blurred vision, double vision and pain.  Respiratory: Negative for cough, hemoptysis, shortness of breath and wheezing.   Cardiovascular: Negative for chest pain, palpitations, orthopnea and leg swelling.  Gastrointestinal: Negative for abdominal pain, constipation, diarrhea, heartburn, nausea and vomiting.  Genitourinary: Negative for dysuria and hematuria.  Musculoskeletal: Negative for back pain and joint pain.  Skin: Negative for rash.  Neurological: Positive for weakness. Negative for sensory change, speech change, focal weakness and headaches.  Endo/Heme/Allergies: Does not bruise/bleed easily.  Psychiatric/Behavioral: Negative for depression. The patient is not nervous/anxious.     DRUG ALLERGIES:  No Known Allergies  VITALS:  Blood pressure (!) 131/55, pulse 81, temperature 98 F (36.7 C), temperature source Oral, resp. rate 19, height 4\' 7"  (1.397 m), weight 50.1 kg (110 lb 6.4 oz), SpO2 98 %.  PHYSICAL EXAMINATION:   Physical Exam  GENERAL:  83 y.o.-year-old patient lying in the bed with no acute distress.  EYES: Pupils equal, round, reactive to light and accommodation. No scleral icterus. Extraocular muscles intact.  HEENT: Head atraumatic, normocephalic. Oropharynx and nasopharynx clear.  NECK:  Supple, no jugular venous distention. No thyroid enlargement, no tenderness.  LUNGS: Normal breath sounds bilaterally, no wheezing, rales, rhonchi. No use of accessory muscles of respiration.  CARDIOVASCULAR: S1, S2 normal. No murmurs, rubs, or gallops.   ABDOMEN: Soft, nontender, nondistended. Bowel sounds present. No organomegaly or mass.  EXTREMITIES: No cyanosis, clubbing or edema b/l.    NEUROLOGIC: Cranial nerves II through XII are intact. No focal Motor or sensory deficits b/l.   PSYCHIATRIC: The patient is alert and Awake SKIN: No obvious rash, lesion, or ulcer.   LABORATORY PANEL:   CBC  Recent Labs Lab 12/24/16 0507  WBC 11.9*  HGB 12.1  HCT 35.1  PLT 225   ------------------------------------------------------------------------------------------------------------------ Chemistries   Recent Labs Lab 12/23/16 1538 12/24/16 0507  NA 137 141  K 3.6 3.3*  CL 100* 108  CO2 28 26  GLUCOSE 210* 75  BUN 48* 37*  CREATININE 1.57* 1.12*  CALCIUM 9.3 8.1*  MG  --  2.1  AST 72*  --   ALT 28  --   ALKPHOS 57  --   BILITOT 0.8  --    ------------------------------------------------------------------------------------------------------------------  Cardiac Enzymes  Recent Labs Lab 12/24/16 0507  TROPONINI 0.05*   ------------------------------------------------------------------------------------------------------------------  RADIOLOGY:  Dg Chest 2 View  Result Date: 12/23/2016 CLINICAL DATA:  Patient fell. Increased weakness x5 days with fatigue. History of breast cancer and hypertension. EXAM: CHEST  2 VIEW COMPARISON:  09/05/2011 FINDINGS: Borderline cardiomegaly. Aorta is not aneurysmal. No pneumonic consolidation. No effusion, pneumothorax or CHF. Surgical clips project over the right axilla and right breast. Osteoarthritis of the glenohumeral joints left worse than right with joint space narrowing and spurring. Thoracolumbar spondylosis. No acute appearing osseous abnormality. IMPRESSION: No active cardiopulmonary disease. Electronically Signed   By: Ashley Royalty M.D.   On: 12/23/2016 17:59   Ct Head Wo Contrast  Result Date: 12/23/2016 CLINICAL DATA:  Weakness and fatigue.  Increased number of falls. EXAM:  CT HEAD WITHOUT CONTRAST TECHNIQUE: Contiguous axial images were obtained from the base of the skull through the vertex without intravenous contrast. COMPARISON:  CT head without contrast 09/05/2011. FINDINGS: Brain: Remote lacunar infarcts involving the anterior limb of the right internal capsule are stable. Moderate atrophy and diffuse white matter disease demonstrate some progression, advanced for age. No acute cortical infarct is present. There is no hemorrhage or mass lesion. Ventricles are proportionate to the degree of atrophy. Vascular: Atherosclerotic calcifications are present in the cavernous internal carotid arteries bilaterally. There is no hyperdense vessel. Skull: Calvarium is intact. No focal lytic or blastic lesions are present. Sinuses/Orbits: The paranasal sinuses and mastoid air cells are clear. The globes and orbits are within normal limits. IMPRESSION: 1. No acute intracranial abnormality. 2. Progression of moderate generalized atrophy and white matter disease. 3. Remote lacunar infarct involving the anterior limb of the right internal capsule. 4. Atherosclerosis without a hyperdense vessel. Electronically Signed   By: San Morelle M.D.   On: 12/23/2016 18:46     ASSESSMENT AND PLAN:   * UTI with leukocytosis and severe weakness On IV ceftriaxone. Wait for urine cultures. Leukocytosis improving. Afebrile.  * Dehydration with acute kidney injury Resolved with IV fluids. We'll continue IV fluids for today and stop them tonight.  * Hypertension Start Norvasc.   * DVT prophylaxis with Lovenox  All the records are reviewed and case discussed with Care Management/Social Workerr. Management plans discussed with the patient, family and they are in agreement.  CODE STATUS: Full code  DVT Prophylaxis: SCDs  TOTAL TIME TAKING CARE OF THIS PATIENT: 35 minutes.   POSSIBLE D/C IN 1-2 DAYS, DEPENDING ON CLINICAL CONDITION.  Hillary Bow R M.D on 12/24/2016 at 2:05  PM  Between 7am to 6pm - Pager - [phone]  After 6pm go to [email or web address] - password [email or web address] [facility]  Office  [phone]  CC: Primary care physician; [name], MD  Note: This dictation was prepared with Dragon dictation along with smaller phrase technology. Any transcriptional errors that result from this process are unintentional.

## 2016-12-25 MED ORDER — AMLODIPINE BESYLATE 2.5 MG PO TABS: 2.5000 mg | ORAL_TABLET | Freq: Every day | ORAL | Status: DC

## 2016-12-25 MED ORDER — TRAMADOL HCL 50 MG PO TABS: 50.0000 mg | ORAL_TABLET | Freq: Four times a day (QID) | ORAL | 0 refills | Status: DC | PRN

## 2016-12-25 NOTE — Clinical Social Work Placement (Signed)
   CLINICAL SOCIAL WORK PLACEMENT  NOTE  Date:  12/25/2016  Patient Details  Name: Erin Shannon MRN: [id number] Date of Birth: Sep 25, 1927  Clinical Social Work is seeking post-discharge placement for this patient at the [place] level of care (*CSW will initial, date and re-position this form in  chart as items are completed):  Yes   Patient/family provided with [facility] Work Department's list of facilities offering this level of care within the geographic area requested by the patient (or if unable, by the patient's family).  Yes   Patient/family informed of their freedom to choose among providers that offer the needed level of care, that participate in Medicare, Medicaid or managed care program needed by the patient, have an available bed and are willing to accept the patient.  Yes   Patient/family informed of [facility]'s ownership interest in [facility] and [facility], as well as of the fact that they are under no obligation to receive care at these facilities.  PASRR submitted to EDS on 12/25/16     PASRR number received on 12/25/16     Existing PASRR number confirmed on       FL2 transmitted to all facilities in geographic area requested by pt/family on       FL2 transmitted to all facilities within larger geographic area on       Patient informed that his/her managed care company has contracts with or will negotiate with certain facilities, including the following:        Yes   Patient/family informed of bed offers received.  Patient chooses bed at  [facility])     Physician recommends and patient chooses bed at  [facility])    Patient to be transferred to  [employer]) on 12/25/16.  Patient to be transferred to facility by  (daughter)     Patient family notified on 12/25/16 of transfer.  Name of family member notified:   (daughter)     PHYSICIAN       Additional Comment:     _______________________________________________ Shela Leff, LCSW 12/25/2016, 5:24 PM

## 2016-12-25 NOTE — Clinical Social Work Note (Signed)
Patient has had a bed offer from [facility] and patient's daughter has accepted. Doug at [facility] can take patient tonight. Patient's daughter to transport patient. Discharge orders sent to [facility]. Shela Leff MSW,LCSW [phone]

## 2016-12-25 NOTE — Progress Notes (Addendum)
Patient prepared for discharge and report called to [facility] at [phone], [facility] Administrator given report. Patient daughter to pick up patient and take to [facility]. Patient is alert to self, vital sign stable, no acute distress noted. Prescription and discharge paperwork placed in folder to given to daughter.

## 2016-12-25 NOTE — Discharge Instructions (Signed)
Regular diet ° °Activity with assistance. °

## 2016-12-25 NOTE — Clinical Social Work Note (Signed)
Clinical Social Work Assessment  Patient Details  Name: Erin Shannon MRN: [id number] Date of Birth: [birth year]  Date of referral:  12/25/16               Reason for consult:  Facility Placement                Permission sought to share information with:  Facility Sport and exercise psychologist, Family Supports Permission granted to share information::  Yes, Verbal Permission Granted  Name::        Agency::     Relationship::     Contact Information:     Housing/Transportation Living arrangements for the past 2 months:  Single Family Home Source of Information:  Patient, Adult Children Patient Interpreter Needed:  None Criminal Activity/Legal Involvement Pertinent to Current Situation/Hospitalization:  No - Comment as needed Significant Relationships:  None Lives with:  Adult Children Do you feel safe going back to the place where you live?  Yes Need for family participation in patient care:  Yes (Comment)  Care giving concerns:  Patient resides with her daughter: Erin Shannon: [phone].    Social Worker assessment / plan:  CSW received notice that PT has recommended STR. CSW spoke with patient and her daughter this afternoon regarding PT recommendations. Patient was confused but very pleasant and was fine with this CSW speaking with her daughter about the recommendations. Patient's daughter is in agreement with STR and is aware a bedsearch will be initiated. Daughter's questions answered. Patient at baseline is able to ambulate and currently she is not ambulatory.  Employment status:  Retired Nurse, adult PT Recommendations:    Information / Referral to community resources:     Patient/Family's Response to care:  Patient and daughter expressed appreciation for CSW assistance.  Patient/Family's Understanding of and Emotional Response to Diagnosis, Current Treatment, and Prognosis:  Patient is unaware of her limitations. Patient's daughter is aware and  understands the STR recommendation.   Emotional Assessment Appearance:  Appears stated age Attitude/Demeanor/Rapport:   (pleasantly confused) Affect (typically observed):  Calm, Pleasant Orientation:  Oriented to Self, Oriented to Place Alcohol / Substance use:  Not Applicable Psych involvement (Current and /or in the community):  No (Comment)  Discharge Needs  Concerns to be addressed:  Care Coordination Readmission within the last 30 days:    Current discharge risk:  None Barriers to Discharge:  No Barriers Identified   Shela Leff, LCSW 12/25/2016, 1:09 PM

## 2016-12-25 NOTE — Care Management Obs Status (Signed)
[place] NOTIFICATION   Patient Details  Name: Erin Shannon MRN: [id number] Date of Birth: 05/08/1927   Medicare Observation Status Notification Given:  Yes (Verbally reviewed by phone with daughter.  copy sent to HIM)    Beverly Sessions, RN 12/25/2016, 5:08 PM

## 2016-12-25 NOTE — NC FL2 (Signed)
[place] LEVEL OF CARE SCREENING TOOL     IDENTIFICATION  Patient Name: Erin Shannon Birthdate: 04/30/1927 Sex: female Admission Date (Current Location): 12/23/2016  [place] and [place] Number:  Engineering geologist and Address:  [facility], [address], [place], [place] [address]      Provider Number: [id number]  Attending Physician Name and Address:  Hillary Bow, MD  Relative Name and Phone Number:       Current Level of Care: Hospital Recommended Level of Care: [place] Prior Approval Number:    Date Approved/Denied:   PASRR Number: [id number]  Discharge Plan: SNF    Current Diagnoses: Patient Active Problem List   Diagnosis Date Noted  . UTI (urinary tract infection) 12/24/2016  . Mobitz (type) I (Wenckebach's) atrioventricular block 12/24/2016  . Demand ischemia ([place]) 12/24/2016  . Weakness 12/24/2016  . ARF (acute renal failure) ([place]) 12/23/2016  . Chronic venous insufficiency 07/27/2014  . BRADYCARDIA 12/19/2010  . DIASTOLIC HEART FAILURE, CHRONIC 12/19/2010  . HYPERTENSION, BENIGN 02/06/2009  . PALPITATIONS 02/06/2009    Orientation RESPIRATION BLADDER Height & Weight     Self, Place  Normal Continent Weight: 110 lb 6.4 oz (50.1 kg) (pt. was weighed twice and bed was zeroed out ) Height:  4\' 7"  (139.7 cm)  BEHAVIORAL SYMPTOMS/MOOD NEUROLOGICAL BOWEL NUTRITION STATUS   (none)  (none) Continent Diet (carb modified)  AMBULATORY STATUS COMMUNICATION OF NEEDS Skin   Extensive Assist Verbally Normal                       Personal Care Assistance Level of Assistance  Bathing, Dressing Bathing Assistance: Limited assistance   Dressing Assistance: Limited assistance     Functional Limitations Info   (no issues)          SPECIAL CARE FACTORS FREQUENCY  PT (By licensed PT)                    Contractures Contractures Info: Not present    Additional Factors Info  Code  Status, Allergies Code Status Info: full Allergies Info: nka           Current Medications (12/25/2016):  This is the current hospital active medication list Current Facility-Administered Medications  Medication Dose Route Frequency Provider Last Rate Last Dose  . acetaminophen (TYLENOL) tablet 650 mg  650 mg Oral Q6H PRN Demetrios Loll, MD       Or  . acetaminophen (TYLENOL) suppository 650 mg  650 mg Rectal Q6H PRN Demetrios Loll, MD      . albuterol (PROVENTIL) (2.5 MG/3ML) 0.083% nebulizer solution 2.5 mg  2.5 mg Nebulization Q2H PRN Demetrios Loll, MD      . amLODipine (NORVASC) tablet 5 mg  5 mg Oral Daily Hillary Bow, MD   5 mg at 12/25/16 1017  . aspirin EC tablet 81 mg  81 mg Oral Daily Ryan M Dunn, PA-C   81 mg at 12/25/16 1016  . bisacodyl (DULCOLAX) EC tablet 5 mg  5 mg Oral Daily PRN Demetrios Loll, MD      . cefTRIAXone (ROCEPHIN) IVPB 1 g  1 g Intravenous Q24H Demetrios Loll, MD   1 g at 12/24/16 2342  . donepezil (ARICEPT) tablet 10 mg  10 mg Oral Daily Demetrios Loll, MD   10 mg at 12/25/16 1016  . enoxaparin (LOVENOX) injection 30 mg  30 mg Subcutaneous Q24H Demetrios Loll, MD   30 mg at  12/24/16 2343  . feeding supplement (ENSURE ENLIVE) (ENSURE ENLIVE) liquid 237 mL  237 mL Oral BID BM Srikar Sudini, MD   237 mL at 12/25/16 1335  . memantine (NAMENDA) tablet 10 mg  10 mg Oral BID Demetrios Loll, MD   10 mg at 12/25/16 1016  . ondansetron (ZOFRAN) tablet 4 mg  4 mg Oral Q6H PRN Demetrios Loll, MD       Or  . ondansetron [facility]) injection 4 mg  4 mg Intravenous Q6H PRN Demetrios Loll, MD      . pravastatin (PRAVACHOL) tablet 40 mg  40 mg Oral Daily Theodoro Grist, MD   40 mg at 12/25/16 1016  . senna-docusate (Senokot-S) tablet 1 tablet  1 tablet Oral QHS PRN Demetrios Loll, MD         Discharge Medications: Please see discharge summary for a list of discharge medications.  Relevant Imaging Results:  Relevant Lab Results:   Additional Information ss: [id number]  Shela Leff, LCSW

## 2016-12-25 NOTE — Progress Notes (Signed)
Physical Therapy Treatment Patient Details Name: Erin Shannon MRN: [id number] DOB: [birth year] Today's Date: 12/25/2016    History of Present Illness Pt is a 83 y/o F who presents after multiple falls at home as well as generalized weakness.  Pt found to have ARF and UTI.  Palpitations felt by Cardiology to be due to PVCs which pt is being treated for.  Pt's PMH includes breast cancer s/p mastectomy, memory loss, syncope and collapse, palpitations.    PT Comments    Information gathered from daughter who was present during session.  PTA pt was living at home alone, often times found in soiled bed and pt has been having multiple falls.  Pt uses cane at baseline.  She has a friend that checks on her each day and calls the daughter to let her know how the pt is doing.  Daughter reporting that she is looking into new living arrangement so pt will have assist.  Pt presents similarly to yesterday and required mod assist via 1 person HHA for sit<>stand x2 from EOB due to instability and L knee pain (h/o L knee OA).  SNF remains appropriate d/c plan.  Pt will benefit from continued skilled PT services to increase functional independence and safety.   Follow Up Recommendations  SNF;Supervision/Assistance - 24 hour     Equipment Recommendations  Rolling walker with 5" wheels    Recommendations for Other Services       Precautions / Restrictions Precautions Precautions: Fall Restrictions Weight Bearing Restrictions: No    Mobility  Bed Mobility Overal bed mobility: Needs Assistance Bed Mobility: Supine to Sit;Sit to Supine     Supine to sit: Min assist;HOB elevated Sit to supine: Min assist   General bed mobility comments: Min assist to elevate trunk as pt pulls on bed rail to achieve sitting.  To return to bed, min assist to assist LEs into bed.  Transfers Overall transfer level: Needs assistance Equipment used: 1 person hand held assist Transfers: Sit to/from Stand Sit to Stand:  Mod assist         General transfer comment: Attempted x2 with each time pt reporting reluctant to release UE support from bed due to L knee pain and unsteadiness.  Pt saying, "I don't believe I can do it" and sits down.  1 person HHA assist provided with each attempt to simulate cane.  Ambulation/Gait             General Gait Details: Unable at this time   Stairs            Wheelchair Mobility    Modified Rankin (Stroke Patients Only)       Balance Overall balance assessment: Needs assistance;History of Falls Sitting-balance support: No upper extremity supported;Feet supported Sitting balance-Leahy Scale: Fair   Postural control: Posterior lean Standing balance support: Bilateral upper extremity supported;During functional activity Standing balance-Leahy Scale: Poor Standing balance comment: Pt relies on UE support                    Cognition Arousal/Alertness: Awake/alert Behavior During Therapy: WFL for tasks assessed/performed (very pleasant) Overall Cognitive Status: History of cognitive impairments - at baseline                      Exercises General Exercises - Lower Extremity Long Arc Quad: Seated;AROM;Left;20 reps;Right;10 reps (LLE x20 due to c/o OA pain) Hip Flexion/Marching: Both;10 reps;Seated    General Comments General comments (skin integrity, edema, etc.):  Information gathered from daughter who was present during session.  PTA pt was living at home alone, often times found in soiled bed and pt has been having multiple falls.  Pt uses cane at baseline.  She has a friend that checks her each day and calls the daughter to let her know how the pt is doing.  Daughter reporting that she is looking into new living arrangement so pt will have assist.      Pertinent Vitals/Pain Pain Assessment: Faces Faces Pain Scale: Hurts even more Pain Location: L knee in standing (h/o OA) Pain Descriptors / Indicators: Grimacing;Moaning Pain  Intervention(s): Limited activity within patient's tolerance;Monitored during session;Repositioned    Home Living                      Prior Function            PT Goals (current goals can now be found in the care plan section) Acute Rehab PT Goals Patient Stated Goal: none stated PT Goal Formulation: With patient Time For Goal Achievement: 01/07/17 Potential to Achieve Goals: Fair Progress towards PT goals: Not progressing toward goals - comment (due to pain and instability)    Frequency    Min 2X/week      PT Plan Current plan remains appropriate    Co-evaluation             End of Session Equipment Utilized During Treatment: Gait belt Activity Tolerance: Patient limited by fatigue;Patient limited by pain Patient left: in bed;with call bell/phone within reach;with bed alarm set;with family/visitor present     Time: 1147-1200 PT Time Calculation (min) (ACUTE ONLY): 13 min  Charges:  $Therapeutic Activity: 8-22 mins                    G Codes:       Collie Siad PT, DPT 12/25/2016, 1:22 PM

## 2016-12-25 NOTE — Discharge Summary (Signed)
[facility] [place] NAME: Erin Shannon    MR#:  [id number]  DATE OF BIRTH:  Jan 15, 1927  DATE OF ADMISSION:  12/23/2016 ADMITTING PHYSICIAN: Demetrios Loll, MD  DATE OF DISCHARGE: No discharge date for patient encounter.  PRIMARY CARE PHYSICIAN: [name], MD   ADMISSION DIAGNOSIS:  Renal insufficiency [N28.9] Elevated troponin [R74.8] Generalized weakness [R53.1] Recurrent falls [R29.6]  DISCHARGE DIAGNOSIS:  Principal Problem:   UTI (urinary tract infection) Active Problems:   ARF (acute renal failure) (HCC)   Mobitz (type) I (Wenckebach's) atrioventricular block   Demand ischemia (HCC)   Weakness   SECONDARY DIAGNOSIS:   Past Medical History:  Diagnosis Date  . Arrhythmia   . Breast cancer [facility])    s/p mastectomy  . Diastolic dysfunction    echo 11/07. EF 60-65%. mild to moderate PI  . GERD (gastroesophageal reflux disease)   . HTN (hypertension)   . Hyperlipidemia   . Memory loss   . Osteoarthritis (arthritis due to wear and tear of joints)   . Palpitations    monitor with PACs/PVCs. neg myoview 2007  . Syncope and collapse      ADMITTING HISTORY  Fall multiple times for the past week. HISTORY OF PRESENT ILLNESS:  Erin Shannon  is a 83 y.o. female with a known history of Hypertension, breast cancer, hyperlipidemia and GERD. The patient was sent to ED due to multiple falls at home. According to her daughter she had poor oral intake and generalized weakness. She had incontinence at baseline. But the patient denies any symptoms. She denies any injuries from her falls. She was found to dehydration and leukocytosis. Chest x-ray didn't show any infiltrate. Urinalysis showed too numerous WBC. She has elevated troponin but denies any chest pain. She was treated with aspirin in the ED.   HOSPITAL COURSE:   * E coli UTI with leukocytosis and severe weakness On IV ceftriaxone. Wait for Sensitivities. Leukocytosis improved.  Afebrile. Treat with ciprofloxacin after discharge  * Dehydration with acute kidney injury Resolved with IV fluids.  Stopped IV fluids  * Hypertension Start Norvasc.   * DVT prophylaxis with Lovenox in hospital  Stable for discharge to SNF  CONSULTS OBTAINED:    DRUG ALLERGIES:  No Known Allergies  DISCHARGE MEDICATIONS:   Current Discharge Medication List    START taking these medications   Details  amLODipine (NORVASC) 2.5 MG tablet Take 1 tablet (2.5 mg total) by mouth daily.    ciprofloxacin (CIPRO) 500 MG tablet Take 1 tablet (500 mg total) by mouth 2 (two) times daily. Qty: 6 tablet, Refills: 0      CONTINUE these medications which have CHANGED   Details  traMADol (ULTRAM) 50 MG tablet Take 1 tablet (50 mg total) by mouth every 6 (six) hours as needed. Qty: 20 tablet, Refills: 0      CONTINUE these medications which have NOT CHANGED   Details  alendronate (FOSAMAX) 70 MG tablet Take 70 mg by mouth once a week. Take with a full glass of water on an empty stomach.    Calcium Citrate-Vitamin D (CALCIUM CITRATE + D PO) Take 1 tablet by mouth daily. 630mg /500     diltiazem (CARTIA XT) 240 MG 24 hr capsule Take 1 capsule (240 mg total) by mouth daily. Qty: 30 capsule, Refills: 11    donepezil (ARICEPT) 10 MG tablet Take 10 mg by mouth daily.      memantine (NAMENDA) 10 MG tablet Take 10  mg by mouth 2 (two) times daily.      Multiple Vitamins-Minerals (COMPLETE WOMENS) TABS Take 1 tablet by mouth daily.      pravastatin (PRAVACHOL) 40 MG tablet Take 40 mg by mouth daily.      celecoxib (CELEBREX) 200 MG capsule Take 200 mg by mouth daily.        Today   VITAL SIGNS:  Blood pressure (!) 129/45, pulse 79, temperature 98.1 F (36.7 C), temperature source Oral, resp. rate 16, height 4\' 7"  (1.397 m), weight 50.1 kg (110 lb 6.4 oz), SpO2 94 %.  I/O:   Intake/Output Summary (Last 24 hours) at 12/25/16 1424 Last data filed at 12/25/16 0900  Gross per 24  hour  Intake            852.5 ml  Output              700 ml  Net            152.5 ml    PHYSICAL EXAMINATION:  Physical Exam  GENERAL:  83 y.o.-year-old patient lying in the bed with no acute distress.  LUNGS: Normal breath sounds bilaterally, no wheezing, rales,rhonchi or crepitation. No use of accessory muscles of respiration.  CARDIOVASCULAR: S1, S2 normal. No murmurs, rubs, or gallops.  ABDOMEN: Soft, non-tender, non-distended. Bowel sounds present. No organomegaly or mass.  NEUROLOGIC: Moves all 4 extremities. PSYCHIATRIC: The patient is alert and awake. Pleasantly confused SKIN: No obvious rash, lesion, or ulcer.   DATA REVIEW:   CBC  Recent Labs Lab 12/24/16 0507  WBC 11.9*  HGB 12.1  HCT 35.1  PLT 225    Chemistries   Recent Labs Lab 12/23/16 1538 12/24/16 0507  NA 137 141  K 3.6 3.3*  CL 100* 108  CO2 28 26  GLUCOSE 210* 75  BUN 48* 37*  CREATININE 1.57* 1.12*  CALCIUM 9.3 8.1*  MG  --  2.1  AST 72*  --   ALT 28  --   ALKPHOS 57  --   BILITOT 0.8  --     Cardiac Enzymes  Recent Labs Lab 12/24/16 0507  TROPONINI 0.05*    Microbiology Results  Results for orders placed or performed during the hospital encounter of 12/23/16  Urine culture     Status: Abnormal (Preliminary result)   Collection Time: 12/23/16  6:05 PM  Result Value Ref Range Status   Specimen Description URINE, RANDOM  Final   Special Requests NONE  Final   Culture (A)  Final    >=100,000 COLONIES/mL ESCHERICHIA COLI SUSCEPTIBILITIES TO FOLLOW Performed at [facility], [place] [address], [place], [place] [address]    Report Status PENDING  Incomplete    RADIOLOGY:  Dg Chest 2 View  Result Date: 12/23/2016 CLINICAL DATA:  Patient fell. Increased weakness x5 days with fatigue. History of breast cancer and hypertension. EXAM: CHEST  2 VIEW COMPARISON:  09/05/2011 FINDINGS: Borderline cardiomegaly. Aorta is not aneurysmal. No pneumonic consolidation. No effusion,  pneumothorax or CHF. Surgical clips project over the right axilla and right breast. Osteoarthritis of the glenohumeral joints left worse than right with joint space narrowing and spurring. Thoracolumbar spondylosis. No acute appearing osseous abnormality. IMPRESSION: No active cardiopulmonary disease. Electronically Signed   By: Ashley Royalty M.D.   On: 12/23/2016 17:59   Ct Head Wo Contrast  Result Date: 12/23/2016 CLINICAL DATA:  Weakness and fatigue.  Increased number of falls. EXAM: CT HEAD WITHOUT CONTRAST TECHNIQUE: Contiguous axial images were  obtained from the base of the skull through the vertex without intravenous contrast. COMPARISON:  CT head without contrast 09/05/2011. FINDINGS: Brain: Remote lacunar infarcts involving the anterior limb of the right internal capsule are stable. Moderate atrophy and diffuse white matter disease demonstrate some progression, advanced for age. No acute cortical infarct is present. There is no hemorrhage or mass lesion. Ventricles are proportionate to the degree of atrophy. Vascular: Atherosclerotic calcifications are present in the cavernous internal carotid arteries bilaterally. There is no hyperdense vessel. Skull: Calvarium is intact. No focal lytic or blastic lesions are present. Sinuses/Orbits: The paranasal sinuses and mastoid air cells are clear. The globes and orbits are within normal limits. IMPRESSION: 1. No acute intracranial abnormality. 2. Progression of moderate generalized atrophy and white matter disease. 3. Remote lacunar infarct involving the anterior limb of the right internal capsule. 4. Atherosclerosis without a hyperdense vessel. Electronically Signed   By: San Morelle M.D.   On: 12/23/2016 18:46    Follow up with PCP in 1 week.  Management plans discussed with the patient, family and they are in agreement.  CODE STATUS:     Code Status Orders        Start     Ordered   12/23/16 2203  Full code  Continuous     12/23/16 2202     Code Status History    Date Active Date Inactive Code Status Order ID Comments User Context   This patient has a current code status but no historical code status.    Advance Directive Documentation   Flowsheet Row Most Recent Value  Type of Advance Directive  Healthcare Power of Attorney  Pre-existing out of facility DNR order (yellow form or pink MOST form)  No data  "MOST" Form in Place?  No data      TOTAL TIME TAKING CARE OF THIS PATIENT ON DAY OF DISCHARGE: more than 30 minutes.   Hillary Bow R M.D on 12/25/2016 at 2:24 PM  Between 7am to 6pm - Pager - [phone]  After 6pm go to [email or web address] - password [email or web address] [place] [facility]  Office  [phone]  CC: Primary care physician; [name], MD  Note: This dictation was prepared with Dragon dictation along with smaller phrase technology. Any transcriptional errors that result from this process are unintentional.

## 2016-12-25 NOTE — Progress Notes (Signed)
[place] [facility] [place] NAME: Erin Shannon    MR#:  [id number]  DATE OF BIRTH:  [birth year]  SUBJECTIVE:  CHIEF COMPLAINT:   Chief Complaint  Patient presents with  . Weakness   Feels well. No concerns. Has dementia. Lives alone. Skilled nursing facility recommended after she worked with physical therapy  REVIEW OF SYSTEMS:    Review of Systems  Constitutional: Negative for chills and fever.  HENT: Negative for sore throat.   Eyes: Negative for blurred vision, double vision and pain.  Respiratory: Negative for cough, hemoptysis, shortness of breath and wheezing.   Cardiovascular: Negative for chest pain, palpitations, orthopnea and leg swelling.  Gastrointestinal: Negative for abdominal pain, constipation, diarrhea, heartburn, nausea and vomiting.  Genitourinary: Negative for dysuria and hematuria.  Musculoskeletal: Negative for back pain and joint pain.  Skin: Negative for rash.  Neurological: Positive for weakness. Negative for sensory change, speech change, focal weakness and headaches.  Endo/Heme/Allergies: Does not bruise/bleed easily.  Psychiatric/Behavioral: Negative for depression. The patient is not nervous/anxious.     DRUG ALLERGIES:  No Known Allergies  VITALS:  Blood pressure (!) 129/45, pulse 79, temperature 98.1 F (36.7 C), temperature source Oral, resp. rate 16, height 4\' 7"  (1.397 m), weight 50.1 kg (110 lb 6.4 oz), SpO2 94 %.  PHYSICAL EXAMINATION:   Physical Exam  GENERAL:  83 y.o.-year-old patient lying in the bed with no acute distress.  EYES: Pupils equal, round, reactive to light and accommodation. No scleral icterus. Extraocular muscles intact.  HEENT: Head atraumatic, normocephalic. Oropharynx and nasopharynx clear.  NECK:  Supple, no jugular venous distention. No thyroid enlargement, no tenderness.  LUNGS: Normal breath sounds bilaterally, no wheezing, rales, rhonchi. No use of accessory muscles of  respiration.  CARDIOVASCULAR: S1, S2 normal. No murmurs, rubs, or gallops.  ABDOMEN: Soft, nontender, nondistended. Bowel sounds present. No organomegaly or mass.  EXTREMITIES: No cyanosis, clubbing or edema b/l.    NEUROLOGIC: Cranial nerves II through XII are intact. No focal Motor or sensory deficits b/l.   PSYCHIATRIC: The patient is alert and Awake SKIN: No obvious rash, lesion, or ulcer.   LABORATORY PANEL:   CBC  Recent Labs Lab 12/24/16 0507  WBC 11.9*  HGB 12.1  HCT 35.1  PLT 225   ------------------------------------------------------------------------------------------------------------------ Chemistries   Recent Labs Lab 12/23/16 1538 12/24/16 0507  NA 137 141  K 3.6 3.3*  CL 100* 108  CO2 28 26  GLUCOSE 210* 75  BUN 48* 37*  CREATININE 1.57* 1.12*  CALCIUM 9.3 8.1*  MG  --  2.1  AST 72*  --   ALT 28  --   ALKPHOS 57  --   BILITOT 0.8  --    ------------------------------------------------------------------------------------------------------------------  Cardiac Enzymes  Recent Labs Lab 12/24/16 0507  TROPONINI 0.05*   ------------------------------------------------------------------------------------------------------------------  RADIOLOGY:  Dg Chest 2 View  Result Date: 12/23/2016 CLINICAL DATA:  Patient fell. Increased weakness x5 days with fatigue. History of breast cancer and hypertension. EXAM: CHEST  2 VIEW COMPARISON:  09/05/2011 FINDINGS: Borderline cardiomegaly. Aorta is not aneurysmal. No pneumonic consolidation. No effusion, pneumothorax or CHF. Surgical clips project over the right axilla and right breast. Osteoarthritis of the glenohumeral joints left worse than right with joint space narrowing and spurring. Thoracolumbar spondylosis. No acute appearing osseous abnormality. IMPRESSION: No active cardiopulmonary disease. Electronically Signed   By: Ashley Royalty M.D.   On: 12/23/2016 17:59   Ct Head Wo Contrast  Result Date:  12/23/2016  CLINICAL DATA:  Weakness and fatigue.  Increased number of falls. EXAM: CT HEAD WITHOUT CONTRAST TECHNIQUE: Contiguous axial images were obtained from the base of the skull through the vertex without intravenous contrast. COMPARISON:  CT head without contrast 09/05/2011. FINDINGS: Brain: Remote lacunar infarcts involving the anterior limb of the right internal capsule are stable. Moderate atrophy and diffuse white matter disease demonstrate some progression, advanced for age. No acute cortical infarct is present. There is no hemorrhage or mass lesion. Ventricles are proportionate to the degree of atrophy. Vascular: Atherosclerotic calcifications are present in the cavernous internal carotid arteries bilaterally. There is no hyperdense vessel. Skull: Calvarium is intact. No focal lytic or blastic lesions are present. Sinuses/Orbits: The paranasal sinuses and mastoid air cells are clear. The globes and orbits are within normal limits. IMPRESSION: 1. No acute intracranial abnormality. 2. Progression of moderate generalized atrophy and white matter disease. 3. Remote lacunar infarct involving the anterior limb of the right internal capsule. 4. Atherosclerosis without a hyperdense vessel. Electronically Signed   By: San Morelle M.D.   On: 12/23/2016 18:46     ASSESSMENT AND PLAN:   * E coli UTI with leukocytosis and severe weakness On IV ceftriaxone. Wait for Sensitivities. Leukocytosis improved. Afebrile. Treat with ciprofloxacin after discharge  * Dehydration with acute kidney injury Resolved with IV fluids.  Stop IV fluids  * Hypertension Start Norvasc.   * DVT prophylaxis with Lovenox  All the records are reviewed and case discussed with Care Management/Social Workerr. Management plans discussed with the patient, family and they are in agreement.  CODE STATUS: Full code  DVT Prophylaxis: SCDs  TOTAL TIME TAKING CARE OF THIS PATIENT: 35 minutes.   Discharge to skilled  nursing facility when bed available  Hillary Bow R M.D on 12/25/2016 at 2:22 PM  Between 7am to 6pm - Pager - [phone]  After 6pm go to [email or web address] - password [email or web address] [facility]  Office  [phone]  CC: Primary care physician; [name], MD  Note: This dictation was prepared with Dragon dictation along with smaller phrase technology. Any transcriptional errors that result from this process are unintentional.

## 2016-12-26 LAB — URINE CULTURE: Culture: >=100000 — AB

## 2017-08-14 ENCOUNTER — Other Ambulatory Visit: Payer: Self-pay | Admitting: Cardiovascular Disease

## 2017-08-16 NOTE — Telephone Encounter (Signed)
Patient needs to contact Dr. Fletcher Anon for a follow up before anymore refills give.

## 2017-08-17 ENCOUNTER — Telehealth: Payer: Self-pay

## 2017-08-17 NOTE — Telephone Encounter (Signed)
l mom to schedule past due appt

## 2017-08-17 NOTE — Telephone Encounter (Signed)
-----   Message from Anselm Pancoast, L[place] sent at 08/16/2017  4:13 PM EDT ----- Patient needs a follow up with Dr. Fletcher Anon for anymore refills.  Thanks Ivin Booty

## 2017-09-02 ENCOUNTER — Encounter: Payer: Self-pay | Admitting: Cardiovascular Disease

## 2017-09-17 ENCOUNTER — Other Ambulatory Visit: Payer: Self-pay | Admitting: Cardiovascular Disease

## 2017-09-20 ENCOUNTER — Telehealth: Payer: Self-pay | Admitting: Cardiovascular Disease

## 2017-09-20 NOTE — Telephone Encounter (Signed)
Family called back. Added 10/23, 4pm

## 2017-09-20 NOTE — Telephone Encounter (Signed)
Left message on daughter 53 voice mail of 10/23 opening with Dr. Fletcher Anon.  Requested call back as soon as possible.

## 2017-09-21 ENCOUNTER — Ambulatory Visit (INDEPENDENT_AMBULATORY_CARE_PROVIDER_SITE_OTHER): Payer: Medicare Other | Admitting: Cardiovascular Disease

## 2017-09-21 ENCOUNTER — Encounter: Payer: Self-pay | Admitting: Cardiovascular Disease

## 2017-09-21 VITALS — BP 146/60 | HR 64 | Ht <= 58 in | Wt 132.0 lb

## 2017-09-21 DIAGNOSIS — Z23 Encounter for immunization: Secondary | ICD-10-CM | POA: Diagnosis not present

## 2017-09-21 DIAGNOSIS — I10 Essential (primary) hypertension: Secondary | ICD-10-CM | POA: Diagnosis not present

## 2017-09-21 DIAGNOSIS — R0602 Shortness of breath: Secondary | ICD-10-CM

## 2017-09-21 DIAGNOSIS — Z01818 Encounter for other preprocedural examination: Secondary | ICD-10-CM | POA: Diagnosis not present

## 2017-09-21 DIAGNOSIS — I493 Ventricular premature depolarization: Secondary | ICD-10-CM | POA: Diagnosis not present

## 2017-09-21 NOTE — Patient Instructions (Addendum)
Medication Instructions:  Your physician recommends that you continue on your current medications as directed. Please refer to the Current Medication list given to you today.   Labwork: none  Testing/Procedures: Your physician has requested that you have an echocardiogram. Echocardiography is a painless test that uses sound waves to create images of your heart. It provides your doctor with information about the size and shape of your heart and how well your heart's chambers and valves are working. This procedure takes approximately one hour. There are no restrictions for this procedure.    Follow-Up: Your physician wants you to follow-up in: 1 year with Dr. Arida.  You will receive a reminder letter in the mail two months in advance. If you don't receive a letter, please call our office to schedule the follow-up appointment.   Any Other Special Instructions Will Be Listed Below (If Applicable).     If you need a refill on your cardiac medications before your next appointment, please call your pharmacy.  Echocardiogram An echocardiogram, or echocardiography, uses sound waves (ultrasound) to produce an image of your heart. The echocardiogram is simple, painless, obtained within a short period of time, and offers valuable information to your health care provider. The images from an echocardiogram can provide information such as:  Evidence of coronary artery disease (CAD).  Heart size.  Heart muscle function.  Heart valve function.  Aneurysm detection.  Evidence of a past heart attack.  Fluid buildup around the heart.  Heart muscle thickening.  Assess heart valve function.  Tell a health care provider about:  Any allergies you have.  All medicines you are taking, including vitamins, herbs, eye drops, creams, and over-the-counter medicines.  Any problems you or family members have had with anesthetic medicines.  Any blood disorders you have.  Any surgeries you have  had.  Any medical conditions you have.  Whether you are pregnant or may be pregnant. What happens before the procedure? No special preparation is needed. Eat and drink normally. What happens during the procedure?  In order to produce an image of your heart, gel will be applied to your chest and a wand-like tool (transducer) will be moved over your chest. The gel will help transmit the sound waves from the transducer. The sound waves will harmlessly bounce off your heart to allow the heart images to be captured in real-time motion. These images will then be recorded.  You may need an IV to receive a medicine that improves the quality of the pictures. What happens after the procedure? You may return to your normal schedule including diet, activities, and medicines, unless your health care provider tells you otherwise. This information is not intended to replace advice given to you by your health care provider. Make sure you discuss any questions you have with your health care provider. Document Released: 11/13/2000 Document Revised: 07/04/2016 Document Reviewed: 07/24/2013 Elsevier Interactive Patient Education  2017 Elsevier Inc.  

## 2017-09-21 NOTE — Progress Notes (Signed)
Cardiology Office Note   Date:  09/21/2017   ID:  Erin, Shannon 1927/05/08, MRN [id number]  PCP:  [name], MD  Cardiologist:   Kathlyn Sacramento, MD   Chief Complaint  Patient presents with  . other    12 month follow up. Patient needs Cardiac clearance for knee surgery. Meds reviewed verbally with patient.       History of Present Illness: Erin Shannon is a 83 y.o. female who presents for a follow-up visit regarding palpitations thought to be due to PVCs preoperative cardiovascular evaluation for possible knee replacement.  She was treated with diltiazem for both that and hypertension. She has done well but suffers from advanced dementia. She denies any chest pain, palpitations or dizziness.  She continues to live by herself but gets support from her kids.  It is hard to obtain any accurate history from her due to her dementia.  The patient suffers from severe right knee arthritis which has significantly affected her quality of life with significant pain.  She has not been able to get any pain medication to help with this and thus the family is strongly considering right knee replacement and they want to know if she is a candidate from a cardiac standpoint.  The patient's physical capacity is significantly limited by her knee arthritis.  Past Medical History:  Diagnosis Date  . Arrhythmia   . Breast cancer [facility])    s/p mastectomy  . Diastolic dysfunction    echo 11/07. EF 60-65%. mild to moderate PI  . GERD (gastroesophageal reflux disease)   . HTN (hypertension)   . Hyperlipidemia   . Memory loss   . Osteoarthritis (arthritis due to wear and tear of joints)   . Palpitations    monitor with PACs/PVCs. neg myoview 2007  . Syncope and collapse     Past Surgical History:  Procedure Laterality Date  . hysterectomy-unspecified area    . masectomy     R     Current Outpatient Prescriptions  Medication Sig Dispense Refill  . alendronate (FOSAMAX) 70 MG tablet  Take 70 mg by mouth once a week. Take with a full glass of water on an empty stomach.    . Calcium Citrate-Vitamin D (CALCIUM CITRATE + D PO) Take 1 tablet by mouth daily. 630mg /500     . CARTIA XT 240 MG 24 hr capsule TAKE 1 CAPSULE BY MOUTH ONCE DAILY 30 capsule 0  . donepezil (ARICEPT) 10 MG tablet Take 10 mg by mouth daily.      . memantine (NAMENDA) 10 MG tablet Take 10 mg by mouth 2 (two) times daily.      . Multiple Vitamins-Minerals (COMPLETE WOMENS) TABS Take 1 tablet by mouth daily.      . pravastatin (PRAVACHOL) 40 MG tablet Take 40 mg by mouth daily.       No current facility-administered medications for this visit.     Allergies:   Patient has no known allergies.    Social History:  The patient  reports that she has never smoked. She has never used smokeless tobacco. She reports that she does not drink alcohol or use drugs.   Family History:  The patient's family history includes Heart disease in her brother; Heart failure in her father.    ROS:  Please see the history of present illness.   Otherwise, review of systems are positive for none.   All other systems are reviewed and negative.    PHYSICAL  EXAM: VS:  BP (!) 146/60 (BP Location: Left Arm, Patient Position: Sitting, Cuff Size: Normal)   Pulse 64   Ht 4\' 8"  (1.422 m)   Wt 132 lb (59.9 kg)   BMI 29.59 kg/m  , BMI Body mass index is 29.59 kg/m. GEN: Well nourished, well developed, in no acute distress  HEENT: normal  Neck: no JVD, carotid bruits, or masses Cardiac: RRR; no murmurs, rubs, or gallops,no edema  Respiratory:  clear to auscultation bilaterally, normal work of breathing GI: soft, nontender, nondistended, + BS MS: no deformity or atrophy  Skin: warm and dry, no rash Neuro:  Strength and sensation are intact Psych: euthymic mood, full affect   EKG:  EKG is ordered today. The ekg ordered today demonstrates normal sinus rhythm with first degree AV block. Possible old anterolateral infarct.  Left  axis deviation.   Recent Labs: 12/23/2016: ALT 28; B Natriuretic Peptide 74.0 12/24/2016: BUN 37; Creatinine, Ser 1.12; Hemoglobin 12.1; Magnesium 2.1; Platelets 225; Potassium 3.3; Sodium 141; TSH 1.374    Lipid Panel No results found for: CHOL, TRIG, HDL, CHOLHDL, VLDL, LDLCALC, LDLDIRECT    Wt Readings from Last 3 Encounters:  09/21/17 132 lb (59.9 kg)  12/23/16 110 lb 6.4 oz (50.1 kg)  07/24/16 135 lb 12 oz (61.6 kg)     No flowsheet data found.    ASSESSMENT AND PLAN:  1.  Preoperative cardiovascular evaluation for possible right knee replacement: Unfortunately, due to the patient's dementia, I am not able to obtain an accurate history in terms of her symptoms although she does seem to have mild shortness of breath without chest pain. Her functional capacity is very reduced due to severe arthritis and thus ideally she should undergo a pharmacologic nuclear stress test before any surgery.  I discussed this with the son and unfortunately, the patient is not able to follow commands to hold still for nuclear stress test.  Thus, we are not able to do a stress test. I am going to obtain an echocardiogram to at least evaluate her LV systolic function.  If echocardiogram is unremarkable, she might be able to proceed with surgery at moderate risk but obviously with incomplete cardiac evaluation due to inability to do stress testing.  2.  PVCs: Well-controlled with diltiazem with no evidence of arrhythmia.   2. Essential hypertension: Blood pressure is reasonably controlled .    Disposition:   FU with me in 1 year  Signed,  Kathlyn Sacramento, MD  09/21/2017 4:01 PM    [facility]

## 2017-09-30 ENCOUNTER — Ambulatory Visit (INDEPENDENT_AMBULATORY_CARE_PROVIDER_SITE_OTHER): Payer: Medicare Other

## 2017-09-30 ENCOUNTER — Other Ambulatory Visit: Payer: Self-pay

## 2017-09-30 DIAGNOSIS — Z01818 Encounter for other preprocedural examination: Secondary | ICD-10-CM

## 2017-09-30 DIAGNOSIS — R0602 Shortness of breath: Secondary | ICD-10-CM | POA: Diagnosis not present

## 2017-10-07 ENCOUNTER — Encounter: Payer: Self-pay | Admitting: Cardiovascular Disease

## 2017-10-13 ENCOUNTER — Ambulatory Visit: Payer: Medicare Other | Admitting: Nurse Practitioner

## 2017-10-19 ENCOUNTER — Other Ambulatory Visit: Payer: Self-pay | Admitting: Cardiovascular Disease

## 2017-10-26 ENCOUNTER — Ambulatory Visit: Payer: Medicare Other | Admitting: Cardiovascular Disease

## 2017-12-17 ENCOUNTER — Other Ambulatory Visit: Payer: Self-pay | Admitting: Cardiovascular Disease

## 2018-03-26 ENCOUNTER — Emergency Department: Payer: Medicare Other

## 2018-03-26 ENCOUNTER — Encounter: Payer: Self-pay | Admitting: Emergency Medicine

## 2018-03-26 ENCOUNTER — Other Ambulatory Visit: Payer: Self-pay

## 2018-03-26 ENCOUNTER — Inpatient Hospital Stay
Admission: EM | Admit: 2018-03-26 | Discharge: 2018-03-28 | DRG: 690 | Disposition: A | Discharge status: Skilled Nursing Facility | Payer: Medicare Other | Attending: Internal Medicine | Admitting: Internal Medicine

## 2018-03-26 DIAGNOSIS — W19XXXA Unspecified fall, initial encounter: Secondary | ICD-10-CM | POA: Diagnosis not present

## 2018-03-26 DIAGNOSIS — M1711 Unilateral primary osteoarthritis, right knee: Secondary | ICD-10-CM | POA: Diagnosis present

## 2018-03-26 DIAGNOSIS — E785 Hyperlipidemia, unspecified: Secondary | ICD-10-CM | POA: Diagnosis present

## 2018-03-26 DIAGNOSIS — M1990 Unspecified osteoarthritis, unspecified site: Secondary | ICD-10-CM | POA: Diagnosis not present

## 2018-03-26 DIAGNOSIS — B9620 Unspecified Escherichia coli [E. coli] as the cause of diseases classified elsewhere: Secondary | ICD-10-CM | POA: Diagnosis not present

## 2018-03-26 DIAGNOSIS — I10 Essential (primary) hypertension: Secondary | ICD-10-CM | POA: Diagnosis present

## 2018-03-26 DIAGNOSIS — K219 Gastro-esophageal reflux disease without esophagitis: Secondary | ICD-10-CM | POA: Diagnosis present

## 2018-03-26 DIAGNOSIS — N390 Urinary tract infection, site not specified: Secondary | ICD-10-CM | POA: Diagnosis present

## 2018-03-26 DIAGNOSIS — S93401A Sprain of unspecified ligament of right ankle, initial encounter: Secondary | ICD-10-CM | POA: Diagnosis present

## 2018-03-26 DIAGNOSIS — R413 Other amnesia: Secondary | ICD-10-CM | POA: Diagnosis not present

## 2018-03-26 DIAGNOSIS — Z66 Do not resuscitate: Secondary | ICD-10-CM | POA: Diagnosis present

## 2018-03-26 DIAGNOSIS — Z853 Personal history of malignant neoplasm of breast: Secondary | ICD-10-CM | POA: Diagnosis not present

## 2018-03-26 DIAGNOSIS — Z79899 Other long term (current) drug therapy: Secondary | ICD-10-CM

## 2018-03-26 DIAGNOSIS — Y92009 Unspecified place in unspecified non-institutional (private) residence as the place of occurrence of the external cause: Secondary | ICD-10-CM

## 2018-03-26 DIAGNOSIS — F0390 Unspecified dementia without behavioral disturbance: Secondary | ICD-10-CM | POA: Diagnosis not present

## 2018-03-26 DIAGNOSIS — R0902 Hypoxemia: Secondary | ICD-10-CM

## 2018-03-26 DIAGNOSIS — Z7983 Long term (current) use of bisphosphonates: Secondary | ICD-10-CM | POA: Diagnosis not present

## 2018-03-26 DIAGNOSIS — M25571 Pain in right ankle and joints of right foot: Secondary | ICD-10-CM

## 2018-03-26 DIAGNOSIS — Z1611 Resistance to penicillins: Secondary | ICD-10-CM | POA: Diagnosis present

## 2018-03-26 LAB — URINALYSIS, COMPLETE (UACMP) WITH MICROSCOPIC
Bilirubin Urine: NEGATIVE
GLUCOSE, UA: NEGATIVE mg/dL
Hgb urine dipstick: NEGATIVE
Ketones, ur: NEGATIVE mg/dL
LEUKOCYTES UA: NEGATIVE
NITRITE: POSITIVE — AB
PH: 7 (ref 5.0–8.0)
Protein, ur: 100 mg/dL — AB
SPECIFIC GRAVITY, URINE: 1.017 (ref 1.005–1.030)

## 2018-03-26 LAB — COMPREHENSIVE METABOLIC PANEL
ALBUMIN: 3.7 g/dL (ref 3.5–5.0)
ALK PHOS: 64 U/L (ref 38–126)
ALT: 26 U/L (ref 14–54)
AST: 43 U/L — AB (ref 15–41)
Anion gap: 8 (ref 5–15)
BUN: 24 mg/dL — AB (ref 6–20)
CALCIUM: 9 mg/dL (ref 8.9–10.3)
CO2: 30 mmol/L (ref 22–32)
CREATININE: 1.05 mg/dL — AB (ref 0.44–1.00)
Chloride: 99 mmol/L — ABNORMAL LOW (ref 101–111)
GFR calc Af Amer: 53 mL/min — ABNORMAL LOW (ref >60)
GFR calc non Af Amer: 46 mL/min — ABNORMAL LOW (ref >60)
GLUCOSE: 140 mg/dL — AB (ref 65–99)
Potassium: 4.7 mmol/L (ref 3.5–5.1)
SODIUM: 137 mmol/L (ref 135–145)
Total Bilirubin: 0.9 mg/dL (ref 0.3–1.2)
Total Protein: 8 g/dL (ref 6.5–8.1)

## 2018-03-26 LAB — CBC WITH DIFFERENTIAL/PLATELET
Basophils Absolute: 0 10*3/uL (ref 0–0.1)
Basophils Relative: 0 %
EOS ABS: 0 10*3/uL (ref 0–0.7)
EOS PCT: 0 %
HCT: 45 % (ref 35.0–47.0)
Hemoglobin: 15.1 g/dL (ref 12.0–16.0)
LYMPHS ABS: 1 10*3/uL (ref 1.0–3.6)
LYMPHS PCT: 7 %
MCH: 31.3 pg (ref 26.0–34.0)
MCHC: 33.6 g/dL (ref 32.0–36.0)
MCV: 93.3 fL (ref 80.0–100.0)
MONOS PCT: 12 %
Monocytes Absolute: 1.8 10*3/uL — ABNORMAL HIGH (ref 0.2–0.9)
Neutro Abs: 12.7 10*3/uL — ABNORMAL HIGH (ref 1.4–6.5)
Neutrophils Relative %: 81 %
PLATELETS: 257 10*3/uL (ref 150–440)
RBC: 4.83 MIL/uL (ref 3.80–5.20)
RDW: 13.3 % (ref 11.5–14.5)
WBC: 15.6 10*3/uL — ABNORMAL HIGH (ref 3.6–11.0)

## 2018-03-26 LAB — LACTIC ACID, PLASMA: Lactic Acid, Venous: 1.6 mmol/L (ref 0.5–1.9)

## 2018-03-26 LAB — SEDIMENTATION RATE: Sed Rate: 30 mm/hr (ref 0–30)

## 2018-03-26 LAB — TROPONIN I: Troponin I: 0.03 ng/mL (ref <0.03)

## 2018-03-26 MED ORDER — ADULT MULTIVITAMIN W/MINERALS CH
1.0000 | ORAL_TABLET | Freq: Every day | ORAL | Status: DC
  Administered 2018-03-27 – 2018-03-28 (×2): 1 via ORAL
  Filled 2018-03-26 (×2): qty 1

## 2018-03-26 MED ORDER — CELECOXIB 200 MG PO CAPS
200.0000 mg | ORAL_CAPSULE | Freq: Every day | ORAL | Status: DC
  Filled 2018-03-26 (×3): qty 1

## 2018-03-26 MED ORDER — SODIUM CHLORIDE 0.9 % IV SOLN
1.0000 g | INTRAVENOUS | Status: DC
  Administered 2018-03-27: 18:00:00 1 g via INTRAVENOUS
  Filled 2018-03-26 (×2): qty 10

## 2018-03-26 MED ORDER — HEPARIN SODIUM (PORCINE) 5000 UNIT/ML IJ SOLN
5000.0000 [IU] | Freq: Three times a day (TID) | INTRAMUSCULAR | Status: DC
  Administered 2018-03-27 – 2018-03-28 (×2): 5000 [IU] via SUBCUTANEOUS
  Filled 2018-03-26 (×4): qty 1

## 2018-03-26 MED ORDER — PRAVASTATIN SODIUM 20 MG PO TABS
40.0000 mg | ORAL_TABLET | Freq: Every evening | ORAL | Status: DC
  Administered 2018-03-27: 18:00:00 40 mg via ORAL
  Filled 2018-03-26: qty 2

## 2018-03-26 MED ORDER — SODIUM CHLORIDE 0.9 % IV SOLN
1.0000 g | Freq: Once | INTRAVENOUS | Status: AC
  Administered 2018-03-26: 1 g via INTRAVENOUS
  Filled 2018-03-26: qty 10

## 2018-03-26 MED ORDER — DONEPEZIL HCL 5 MG PO TABS
10.0000 mg | ORAL_TABLET | Freq: Every evening | ORAL | Status: DC
  Administered 2018-03-27: 18:00:00 10 mg via ORAL
  Filled 2018-03-26 (×3): qty 2

## 2018-03-26 MED ORDER — DOCUSATE SODIUM 100 MG PO CAPS
100.0000 mg | ORAL_CAPSULE | Freq: Two times a day (BID) | ORAL | Status: DC | PRN
Start: 2018-03-26 — End: 2018-03-28

## 2018-03-26 MED ORDER — DILTIAZEM HCL ER COATED BEADS 240 MG PO CP24
240.0000 mg | ORAL_CAPSULE | Freq: Every day | ORAL | Status: DC
  Administered 2018-03-27 – 2018-03-28 (×2): 240 mg via ORAL
  Filled 2018-03-26 (×2): qty 1

## 2018-03-26 MED ORDER — MEMANTINE HCL 5 MG PO TABS
10.0000 mg | ORAL_TABLET | Freq: Two times a day (BID) | ORAL | Status: DC
  Administered 2018-03-27 – 2018-03-28 (×3): 10 mg via ORAL
  Filled 2018-03-26 (×3): qty 2

## 2018-03-26 MED ORDER — CALCIUM CITRATE 950 (200 CA) MG PO TABS
1.0000 | ORAL_TABLET | Freq: Two times a day (BID) | ORAL | Status: DC
  Administered 2018-03-27 (×2): 200 mg via ORAL
  Filled 2018-03-26 (×6): qty 1

## 2018-03-26 NOTE — Progress Notes (Signed)
Family Meeting Note  Advance Directive:yes  Today a meeting took place with the daughter.  Patient is unable to participate due [id number] capacity due to dementia   The following clinical team members were present during this meeting:MD  The following were discussed:Patient's diagnosis: dementia, UTI, fall , Patient's progosis: Unable to determine and Goals for treatment: DNR  Additional follow-up to be provided: Ortho  Time spent during discussion:20 minutes  Erin Basta, MD

## 2018-03-26 NOTE — ED Notes (Signed)
This RN to bedside at this time. Apologized for and explained delay to patient and family. Pt's daughter states understanding. Will continue to monitor until care handoff.

## 2018-03-26 NOTE — ED Provider Notes (Signed)
[facility] Emergency Department Provider Note       Time seen: ----------------------------------------- 3:18 PM on 03/26/2018 ----------------------------------------- Level V caveat: History/ROS limited by altered mental status  I have reviewed the triage vital signs and the nursing notes.  HISTORY   Chief Complaint Fall    HPI Erin Shannon is a 84 y.o. female with a history of breast cancer, GERD, hypertension, hyperlipidemia, syncope who presents to the ED for a fall that occurred on Monday.  She was not evaluated for this, according to EMS she has right ankle pain has a history of dementia.  She lives alone but has a caregiver that comes and checks on her daily.  EMS reports patient was not evaluated after the initial fall.  She cannot give further review of systems all report.  Past Medical History:  Diagnosis Date  . Arrhythmia   . Breast cancer [facility])    s/p mastectomy  . Diastolic dysfunction    echo 11/07. EF 60-65%. mild to moderate PI  . GERD (gastroesophageal reflux disease)   . HTN (hypertension)   . Hyperlipidemia   . Memory loss   . Osteoarthritis (arthritis due to wear and tear of joints)   . Palpitations    monitor with PACs/PVCs. neg myoview 2007  . Syncope and collapse     Patient Active Problem List   Diagnosis Date Noted  . UTI (urinary tract infection) 12/24/2016  . Mobitz (type) I (Wenckebach's) atrioventricular block 12/24/2016  . Demand ischemia ([place]) 12/24/2016  . Weakness 12/24/2016  . ARF (acute renal failure) ([place]) 12/23/2016  . Chronic venous insufficiency 07/27/2014  . BRADYCARDIA 12/19/2010  . DIASTOLIC HEART FAILURE, CHRONIC 12/19/2010  . HYPERTENSION, BENIGN 02/06/2009  . PALPITATIONS 02/06/2009    Past Surgical History:  Procedure Laterality Date  . hysterectomy-unspecified area    . masectomy     R    Allergies Patient has no known allergies.  Social History Social History   Tobacco Use  .  Smoking status: Never Smoker  . Smokeless tobacco: Never Used  . Tobacco comment: tobacco use -no  Substance Use Topics  . Alcohol use: No  . Drug use: No   Review of Systems Constitutional: Negative for fever. Cardiovascular: Negative for chest pain. Respiratory: Negative for shortness of breath. Gastrointestinal: Negative for abdominal pain, vomiting and diarrhea. Musculoskeletal: Positive for right ankle and right knee pain Skin: Negative for rash. Neurological: Negative for headaches, focal weakness or numbness.  All systems negative/normal/unremarkable except as stated in the HPI  ____________________________________________   PHYSICAL EXAM:  VITAL SIGNS: ED Triage Vitals  Enc Vitals Group     BP 03/26/18 1504 (!) 159/81     Pulse Rate 03/26/18 1504 (!) 106     Resp 03/26/18 1504 18     Temp 03/26/18 1504 99.9 F (37.7 C)     Temp Source 03/26/18 1504 Oral     SpO2 03/26/18 1504 92 %     Weight 03/26/18 1459 145 lb (65.8 kg)     Height 03/26/18 1459 4\' 11"  (1.499 m)     Head Circumference --      Peak Flow --      Pain Score --      Pain Loc --      Pain Edu? --      Excl. in [place]? --    Constitutional: Alert disoriented.  Well appearing and in no distress. Eyes: Conjunctivae are normal. Normal extraocular movements. ENT   Head:  Normocephalic and atraumatic.   Nose: No congestion/rhinnorhea.   Mouth/Throat: Mucous membranes are moist.   Neck: No stridor. Cardiovascular: Rapid rate, regular rhythm. No murmurs, rubs, or gallops. Respiratory: Normal respiratory effort without tachypnea nor retractions. Breath sounds are clear and equal bilaterally. No wheezes/rales/rhonchi. Gastrointestinal: Soft and nontender. Normal bowel sounds Musculoskeletal: Pain with range of motion of the right lower extremity, right knee brace is present.  There is right ankle swelling with mild erythema, pain with range of motion of the right ankle and right lower extremity  edema compared to left Neurologic:  Normal speech and language. No gross focal neurologic deficits are appreciated.  Skin:  Skin is warm, dry and intact.  Erythema on the right ankle Psychiatric: Mood and affect are normal. Speech and behavior are normal.  ___________________________________________  ED COURSE:  As part of my medical decision making, I reviewed the following data within the [place] History obtained from family if available, nursing notes, old chart and ekg, as well as notes from prior ED visits. Patient presented for a fall that was noted to be borderline febrile and tachycardic, we will assess with labs and imaging as indicated at this time.   Procedures ____________________________________________   LABS (pertinent positives/negatives)  Labs Reviewed  COMPREHENSIVE METABOLIC PANEL - Abnormal; Notable for the following components:      Result Value   Chloride 99 (*)    Glucose, Bld 140 (*)    BUN 24 (*)    Creatinine, Ser 1.05 (*)    AST 43 (*)    GFR calc non Af Amer 46 (*)    GFR calc Af Amer 53 (*)    All other components within normal limits  TROPONIN I - Abnormal; Notable for the following components:   Troponin I 0.03 (*)    All other components within normal limits  CBC WITH DIFFERENTIAL/PLATELET - Abnormal; Notable for the following components:   WBC 15.6 (*)    Neutro Abs 12.7 (*)    Monocytes Absolute 1.8 (*)    All other components within normal limits  BLOOD GAS, VENOUS - Abnormal; Notable for the following components:   Bicarbonate 28.5 (*)    Acid-Base Excess 3.1 (*)    All other components within normal limits  URINALYSIS, COMPLETE (UACMP) WITH MICROSCOPIC - Abnormal; Notable for the following components:   Color, Urine YELLOW (*)    APPearance HAZY (*)    Protein, ur 100 (*)    Nitrite POSITIVE (*)    Bacteria, UA MANY (*)    All other components within normal limits  CULTURE, BLOOD (ROUTINE X 2)  CULTURE, BLOOD  (ROUTINE X 2)  URINE CULTURE  SEDIMENTATION RATE  LACTIC ACID, PLASMA    RADIOLOGY Images were viewed by me  Right knee x-rays, right ankle x-rays, US venous right lower extremity IMPRESSION: Small left effusion with underlying opacity, likely atelectasis. No other acute abnormality. IMPRESSION: Soft tissue swelling.  No identified fracture. IMPRESSION: Chondrocalcinosis consistent with CPPD. Severe degenerative changes. No fracture or effusion.  ____________________________________________  DIFFERENTIAL DIAGNOSIS   Fracture, contusion, sepsis, dehydration, electrolyte abnormality, DVT  FINAL ASSESSMENT AND PLAN  Fall, right ankle pain, UTI   Plan: The patient had presented for fall and ankle pain but was noted to have some SIRS criteria. Patient's labs did reveal leukocytosis and likely UTI which she has had in the past.  Previous urinalysis was positive for E. coli that was pansensitive.  Patient's imaging revealed small pleural effusion,  soft tissue swelling in the ankle but was otherwise unremarkable.  Given the fact that she cannot bear weight on the right ankle, I will order an MRI.  She also has sirs criteria and is possibly septic from a UTI.  I will discuss with the hospitalist for admission, she has received IV Rocephin.   Laurence Aly, MD   Note: This note was generated in part or whole with voice recognition software. Voice recognition is usually quite accurate but there are transcription errors that can and very often do occur. I apologize for any typographical errors that were not detected and corrected.     Earleen Newport, MD 03/26/18 [phone]

## 2018-03-26 NOTE — ED Notes (Signed)
Patient transported to MRI 

## 2018-03-26 NOTE — ED Notes (Signed)
Pt desat to 88% on RA, pt placed on 2L via [place], O2 saturation 93%.

## 2018-03-26 NOTE — ED Triage Notes (Signed)
Pt presents to ED via [facility] s/p fall on Monday. Per EMS pt with c/o R ankle pain, has hx of dementia, is pleasantly confused, and live alone. EMS reports pt was not evaluated after the fall.

## 2018-03-26 NOTE — H&P (Signed)
[facility] [place] NAME: Erin Shannon    MR#:  [id number]  DATE OF BIRTH:  [birth year]  DATE OF ADMISSION:  03/26/2018  PRIMARY CARE PHYSICIAN: [name], MD   REQUESTING/REFERRING PHYSICIAN: Jimmye Norman   CHIEF COMPLAINT:   Chief Complaint  Patient presents with  . Fall    HISTORY OF PRESENT ILLNESS: Erin Shannon  is a 84 y.o. female with a known history of Breast cancer, CHF, HLD, Htn- Have dementia at home walks with a cane and a visiting CNA. Had more confusion for last few days and a fall with injury to right ankle and pain. She is not able to walk much and more confused, so brought to ER. She have right ankle swelling and pain, but on Xray no fracture Noted to have UTI. Her daughter in room.  PAST MEDICAL HISTORY:   Past Medical History:  Diagnosis Date  . Arrhythmia   . Breast cancer [facility])    s/p mastectomy  . Diastolic dysfunction    echo 11/07. EF 60-65%. mild to moderate PI  . GERD (gastroesophageal reflux disease)   . HTN (hypertension)   . Hyperlipidemia   . Memory loss   . Osteoarthritis (arthritis due to wear and tear of joints)   . Palpitations    monitor with PACs/PVCs. neg myoview 2007  . Syncope and collapse     PAST SURGICAL HISTORY:  Past Surgical History:  Procedure Laterality Date  . hysterectomy-unspecified area    . masectomy     R    SOCIAL HISTORY:  Social History   Tobacco Use  . Smoking status: Never Smoker  . Smokeless tobacco: Never Used  . Tobacco comment: tobacco use -no  Substance Use Topics  . Alcohol use: No    FAMILY HISTORY:  Family History  Problem Relation Age of Onset  . Heart failure Father   . Heart disease Brother     DRUG ALLERGIES: No Known Allergies  REVIEW OF SYSTEMS:   CONSTITUTIONAL: No fever,have fatigue or weakness.  EYES: No blurred or double vision.  EARS, NOSE, AND THROAT: No tinnitus or ear pain.  RESPIRATORY: No cough, shortness of breath,  wheezing or hemoptysis.  CARDIOVASCULAR: No chest pain, orthopnea, edema.  GASTROINTESTINAL: No nausea, vomiting, diarrhea or abdominal pain.  GENITOURINARY: No dysuria, hematuria.  ENDOCRINE: No polyuria, nocturia,  HEMATOLOGY: No anemia, easy bruising or bleeding SKIN: No rash or lesion. MUSCULOSKELETAL: No joint pain or arthritis.   NEUROLOGIC: No tingling, numbness, weakness.  PSYCHIATRY: No anxiety or depression.   MEDICATIONS AT HOME:  Prior to Admission medications   Medication Sig Start Date End Date Taking? Authorizing Provider  alendronate (FOSAMAX) 70 MG tablet Take 70 mg by mouth every Monday. Take with a full glass of water on an empty stomach.    Yes [provider]  Calcium Citrate-Vitamin D (CALCIUM CITRATE + D PO) Take 1 tablet by mouth every evening. 630mg /500    Yes [provider]  CARTIA XT 240 MG 24 hr capsule TAKE 1 CAPSULE BY MOUTH ONCE DAILY 12/17/17  Yes Wellington Hampshire, MD  celecoxib (CELEBREX) 200 MG capsule Take 1 capsule by mouth daily. 05/22/15  Yes [provider]  donepezil (ARICEPT) 10 MG tablet Take 10 mg by mouth every evening.    Yes [provider]  memantine (NAMENDA) 10 MG tablet Take 10 mg by mouth 2 (two) times daily.     Yes [provider]  Multiple  Vitamins-Minerals (COMPLETE WOMENS) TABS Take 1 tablet by mouth daily.     Yes [provider]  pravastatin (PRAVACHOL) 40 MG tablet Take 40 mg by mouth every evening.    Yes [provider]      PHYSICAL EXAMINATION:   VITAL SIGNS: Blood pressure (!) 142/75, pulse 90, temperature 99.9 F (37.7 C), temperature source Oral, resp. rate 20, height 4\' 11"  (1.499 m), weight 65.8 kg (145 lb), SpO2 94 %.  GENERAL:  84 y.o.-year-old patient lying in the bed with no acute distress.  EYES: Pupils equal, round, reactive to light and accommodation. No scleral icterus. Extraocular muscles intact.  HEENT: Head atraumatic, normocephalic. Oropharynx  and nasopharynx clear.  NECK:  Supple, no jugular venous distention. No thyroid enlargement, no tenderness.  LUNGS: Normal breath sounds bilaterally, no wheezing, rales,rhonchi or crepitation. No use of accessory muscles of respiration.  CARDIOVASCULAR: S1, S2 normal. No murmurs, rubs, or gallops.  ABDOMEN: Soft, nontender, nondistended. Bowel sounds present. No organomegaly or mass.  EXTREMITIES: right ankle edema, no cyanosis, or clubbing.  NEUROLOGIC: Cranial nerves II through XII are intact. Muscle strength 4/5 in all extremities. Sensation intact. Gait not checked.  PSYCHIATRIC: The patient is alert and oriented x 1.  SKIN: No obvious rash, lesion, or ulcer.   LABORATORY PANEL:   CBC Recent Labs  Lab 03/26/18 1533  WBC 15.6*  HGB 15.1  HCT 45.0  PLT 257  MCV 93.3  MCH 31.3  MCHC 33.6  RDW 13.3  LYMPHSABS 1.0  MONOABS 1.8*  EOSABS 0.0  BASOSABS 0.0   ------------------------------------------------------------------------------------------------------------------  Chemistries  Recent Labs  Lab 03/26/18 1533  NA 137  K 4.7  CL 99*  CO2 30  GLUCOSE 140*  BUN 24*  CREATININE 1.05*  CALCIUM 9.0  AST 43*  ALT 26  ALKPHOS 64  BILITOT 0.9   ------------------------------------------------------------------------------------------------------------------ estimated creatinine clearance is 29.9 mL/min (A) (by C-G formula based on SCr of 1.05 mg/dL (H)). ------------------------------------------------------------------------------------------------------------------ No results for input(s): TSH, T4TOTAL, T3FREE, THYROIDAB in the last 72 hours.  Invalid input(s): FREET3   Coagulation profile No results for input(s): INR, PROTIME in the last 168 hours. ------------------------------------------------------------------------------------------------------------------- No results for input(s): DDIMER in the last 72  hours. -------------------------------------------------------------------------------------------------------------------  Cardiac Enzymes Recent Labs  Lab 03/26/18 1533  TROPONINI 0.03*   ------------------------------------------------------------------------------------------------------------------ Invalid input(s): POCBNP  ---------------------------------------------------------------------------------------------------------------  Urinalysis    Component Value Date/Time   COLORURINE YELLOW (A) 03/26/2018 1533   APPEARANCEUR HAZY (A) 03/26/2018 1533   LABSPEC 1.017 03/26/2018 1533   PHURINE 7.0 03/26/2018 1533   GLUCOSEU NEGATIVE 03/26/2018 1533   HGBUR NEGATIVE 03/26/2018 1533   BILIRUBINUR NEGATIVE 03/26/2018 1533   KETONESUR NEGATIVE 03/26/2018 1533   PROTEINUR 100 (A) 03/26/2018 1533   NITRITE POSITIVE (A) 03/26/2018 1533   LEUKOCYTESUR NEGATIVE 03/26/2018 1533     RADIOLOGY: Dg Ankle Complete Right  Result Date: 03/26/2018 CLINICAL DATA:  Status post fall several days ago.  Pain. EXAM: RIGHT ANKLE - COMPLETE 3+ VIEW COMPARISON:  None. FINDINGS: Marked soft tissue swelling in the ankle, lateral greater than medial. Vascular calcifications. No acute fractures are seen. No other acute abnormalities. IMPRESSION: Soft tissue swelling.  No identified fracture. Electronically Signed   By: Dorise Bullion III M.D   On: 03/26/2018 16:34   US Venous Img Lower Unilateral Right  Result Date: 03/26/2018 CLINICAL DATA:  Fall.  Edema. EXAM: RIGHT LOWER EXTREMITY VENOUS DOPPLER ULTRASOUND TECHNIQUE: Gray-scale sonography with graded compression, as well as color Doppler and duplex ultrasound were  performed to evaluate the lower extremity deep venous systems from the level of the common femoral vein and including the common femoral, femoral, profunda femoral, popliteal and calf veins including the posterior tibial, peroneal and gastrocnemius veins when visible. The superficial great  saphenous vein was also interrogated. Spectral Doppler was utilized to evaluate flow at rest and with distal augmentation maneuvers in the common femoral, femoral and popliteal veins. COMPARISON:  None. FINDINGS: Contralateral Common Femoral Vein: Respiratory phasicity is normal and symmetric with the symptomatic side. No evidence of thrombus. Normal compressibility. Common Femoral Vein: No evidence of thrombus. Normal compressibility, respiratory phasicity and response to augmentation. Saphenofemoral Junction: No evidence of thrombus. Normal compressibility and flow on color Doppler imaging. Profunda Femoral Vein: No evidence of thrombus. Normal compressibility and flow on color Doppler imaging. Femoral Vein: No evidence of thrombus. Normal compressibility, respiratory phasicity and response to augmentation. Popliteal Vein: No evidence of thrombus. Normal compressibility, respiratory phasicity and response to augmentation. Calf Veins: No evidence of thrombus. Normal compressibility and flow on color Doppler imaging. Superficial Great Saphenous Vein: No evidence of thrombus. Normal compressibility. Venous Reflux:  None. Other Findings: There is a Engineer, agricultural cyst measuring 6.2 x 2.1 x 4.0 cm. IMPRESSION: 6.2 x 2.1 x 4.0 cm right popliteal Baker's cyst.  No DVT. Electronically Signed   By: Dorise Bullion III M.D   On: 03/26/2018 16:37   Dg Chest Port 1 View  Result Date: 03/26/2018 CLINICAL DATA:  Status post fall.  Pain. EXAM: PORTABLE CHEST 1 VIEW COMPARISON:  December 23, 2016 FINDINGS: Small left effusion with atelectasis. The heart, hila, mediastinum, lungs, and pleura are otherwise normal. Degenerative changes in the shoulders. IMPRESSION: Small left effusion with underlying opacity, likely atelectasis. No other acute abnormality. Electronically Signed   By: Dorise Bullion III M.D   On: 03/26/2018 16:35   Dg Knee Complete 4 Views Right  Result Date: 03/26/2018 CLINICAL DATA:  Pain after fall EXAM:  RIGHT KNEE - COMPLETE 4+ VIEW COMPARISON:  None. FINDINGS: Severe tricompartmental degenerative changes. No acute fractures are seen. No joint effusion noted. Chondrocalcinosis. IMPRESSION: Chondrocalcinosis consistent with CPPD. Severe degenerative changes. No fracture or effusion. Electronically Signed   By: Dorise Bullion III M.D   On: 03/26/2018 16:35    EKG: Orders placed or performed during the hospital encounter of 03/26/18  . ED EKG 12-Lead  . ED EKG 12-Lead    IMPRESSION AND PLAN:  * UTI   Altered mental status    IV rocephin, Follow ur cx  * Right ankle pain   No fracture on Xray   Will get Ortho consult.  * dementia   Cont home meds  * Weakness   Get PT eval.  * Hyperlipidemia   Cont pravastatin.  All the records are reviewed and case discussed with ED provider. Management plans discussed with the patient, family and they are in agreement.  CODE STATUS: DNR Code Status History    Date Active Date Inactive Code Status Order ID Comments User Context   12/23/2016 2202 12/25/2016 2121 Full Code [id number]  Demetrios Loll, MD ED       TOTAL TIME TAKING CARE OF THIS PATIENT: 50 minutes.    Vaughan Basta M.D on 03/26/2018   Between 7am to 6pm - Pager - [phone]  After 6pm go to [email or web address] - password [email or web address] [facility]  Office  [phone]  CC: Primary care physician; [name], MD   Note: This dictation was prepared with Viviann Spare  dictation along with smaller phrase technology. Any transcriptional errors that result from this process are unintentional.

## 2018-03-27 ENCOUNTER — Inpatient Hospital Stay: Payer: Medicare Other

## 2018-03-27 DIAGNOSIS — N390 Urinary tract infection, site not specified: Secondary | ICD-10-CM | POA: Diagnosis not present

## 2018-03-27 DIAGNOSIS — M25571 Pain in right ankle and joints of right foot: Secondary | ICD-10-CM | POA: Diagnosis not present

## 2018-03-27 LAB — BASIC METABOLIC PANEL
ANION GAP: 7 (ref 5–15)
BUN: 24 mg/dL — ABNORMAL HIGH (ref 6–20)
CALCIUM: 8.4 mg/dL — AB (ref 8.9–10.3)
CO2: 27 mmol/L (ref 22–32)
Chloride: 106 mmol/L (ref 101–111)
Creatinine, Ser: 0.99 mg/dL (ref 0.44–1.00)
GFR, EST AFRICAN AMERICAN: 57 mL/min — AB (ref >60)
GFR, EST NON AFRICAN AMERICAN: 49 mL/min — AB (ref >60)
GLUCOSE: 104 mg/dL — AB (ref 65–99)
Potassium: 4.3 mmol/L (ref 3.5–5.1)
Sodium: 140 mmol/L (ref 135–145)

## 2018-03-27 LAB — CBC
HCT: 38.3 % (ref 35.0–47.0)
Hemoglobin: 13.1 g/dL (ref 12.0–16.0)
MCH: 31.7 pg (ref 26.0–34.0)
MCHC: 34.2 g/dL (ref 32.0–36.0)
MCV: 92.5 fL (ref 80.0–100.0)
Platelets: 202 10*3/uL (ref 150–440)
RBC: 4.14 MIL/uL (ref 3.80–5.20)
RDW: 13.1 % (ref 11.5–14.5)
WBC: 12.2 10*3/uL — ABNORMAL HIGH (ref 3.6–11.0)

## 2018-03-27 MED ORDER — IBUPROFEN 400 MG PO TABS
400.0000 mg | ORAL_TABLET | Freq: Four times a day (QID) | ORAL | Status: DC | PRN
  Administered 2018-03-27: 400 mg via ORAL
  Filled 2018-03-27: qty 1

## 2018-03-27 MED ORDER — CEPHALEXIN 250 MG PO CAPS: 250.0000 mg | ORAL_CAPSULE | Freq: Three times a day (TID) | ORAL | 0 refills | Status: AC

## 2018-03-27 MED ORDER — CEPHALEXIN 500 MG PO CAPS: 500.0000 mg | ORAL_CAPSULE | Freq: Three times a day (TID) | ORAL | 0 refills | Status: DC

## 2018-03-27 MED ORDER — IBUPROFEN 400 MG PO TABS: 400.0000 mg | ORAL_TABLET | Freq: Four times a day (QID) | ORAL | 0 refills | Status: DC | PRN

## 2018-03-27 MED ORDER — CEPHALEXIN 250 MG PO CAPS: 250.0000 mg | ORAL_CAPSULE | Freq: Three times a day (TID) | ORAL | 0 refills | Status: DC

## 2018-03-27 MED ORDER — ORAL CARE MOUTH RINSE
15.0000 mL | Freq: Two times a day (BID) | OROMUCOSAL | Status: DC
  Administered 2018-03-27 (×2): 15 mL via OROMUCOSAL

## 2018-03-27 NOTE — NC FL2 (Signed)
[place] LEVEL OF CARE SCREENING TOOL     IDENTIFICATION  Patient Name: Erin Shannon Birthdate: 07/06/1927 Sex: female Admission Date (Current Location): 03/26/2018  [place] and [place] Number:  Engineering geologist and Address:  [facility], [address] [place], [place] [address]      Provider Number: [id number]  Attending Physician Name and Address:  Bettey Costa, MD  Relative Name and Phone Number:  Sula Rumple (daughter) [phone]    Current Level of Care: Hospital Recommended Level of Care: [place] Prior Approval Number:    Date Approved/Denied:   PASRR Number: [id number]  Discharge Plan: SNF    Current Diagnoses: Patient Active Problem List   Diagnosis Date Noted  . Acute lower UTI 03/26/2018  . UTI (urinary tract infection) 12/24/2016  . Mobitz (type) I (Wenckebach's) atrioventricular block 12/24/2016  . Demand ischemia ([place]) 12/24/2016  . Weakness 12/24/2016  . ARF (acute renal failure) ([place]) 12/23/2016  . Chronic venous insufficiency 07/27/2014  . BRADYCARDIA 12/19/2010  . DIASTOLIC HEART FAILURE, CHRONIC 12/19/2010  . HYPERTENSION, BENIGN 02/06/2009  . PALPITATIONS 02/06/2009    Orientation RESPIRATION BLADDER Height & Weight     Self, Situation  O2(2L o2) Incontinent Weight: 145 lb (65.8 kg) Height:  4\' 11"  (149.9 cm)  BEHAVIORAL SYMPTOMS/MOOD NEUROLOGICAL BOWEL NUTRITION STATUS      Continent Diet(Heart healthy)  AMBULATORY STATUS COMMUNICATION OF NEEDS Skin   Extensive Assist Verbally Normal                       Personal Care Assistance Level of Assistance  Bathing, Feeding, Dressing Bathing Assistance: Limited assistance Feeding assistance: Independent Dressing Assistance: Limited assistance     Functional Limitations Info  Hearing, Sight, Speech Sight Info: Adequate Hearing Info: Impaired Speech Info: Adequate    SPECIAL CARE FACTORS FREQUENCY  PT (By licensed  PT)     PT Frequency: 5X per week              Contractures Contractures Info: Not present    Additional Factors Info  Code Status, Allergies Code Status Info: DNR Allergies Info: No Known allergies           Current Medications (03/27/2018):  This is the current hospital active medication list Current Facility-Administered Medications  Medication Dose Route Frequency Provider Last Rate Last Dose  . calcium citrate (CALCITRATE - dosed in mg elemental calcium) tablet 200 mg of elemental calcium  1 tablet Oral BID Vaughan Basta, MD   200 mg of elemental calcium at 03/27/18 1055  . cefTRIAXone (ROCEPHIN) 1 g in sodium chloride 0.9 % 100 mL IVPB  1 g Intravenous Q24H Vaughan Basta, MD      . celecoxib (CELEBREX) capsule 200 mg  200 mg Oral Daily Vaughan Basta, MD      . diltiazem (CARDIZEM CD) 24 hr capsule 240 mg  240 mg Oral Daily Vaughan Basta, MD   240 mg at 03/27/18 1051  . docusate sodium (COLACE) capsule 100 mg  100 mg Oral BID PRN Vaughan Basta, MD      . donepezil (ARICEPT) tablet 10 mg  10 mg Oral QPM Vaughan Basta, MD      . heparin injection 5,000 Units  5,000 Units Subcutaneous Q8H Vaughan Basta, MD      . ibuprofen (ADVIL,MOTRIN) tablet 400 mg  400 mg Oral Q6H PRN Bettey Costa, MD      . MEDLINE mouth rinse  15 mL  Mouth Rinse BID Vaughan Basta, MD   15 mL at 03/27/18 1049  . memantine (NAMENDA) tablet 10 mg  10 mg Oral BID Vaughan Basta, MD   10 mg at 03/27/18 1049  . multivitamin with minerals tablet 1 tablet  1 tablet Oral Daily Vaughan Basta, MD   1 tablet at 03/27/18 1049  . pravastatin (PRAVACHOL) tablet 40 mg  40 mg Oral QPM Vaughan Basta, MD         Discharge Medications: Please see discharge summary for a list of discharge medications.  Relevant Imaging Results:  Relevant Lab Results:   Additional Information SS# [id number]  Zettie Pho, LCSW

## 2018-03-27 NOTE — Evaluation (Signed)
Physical Therapy Evaluation Patient Details Name: BETHENY SUCHECKI MRN: [id number] DOB: 11-13-1927 Today's Date: 03/27/2018   History of Present Illness  Patient is an 84 year old female admitted from home s/p fall and acute lower UTI.  PMH includes breast CA, CHF, HLD, Htn, memory loss and arrhythmia.   Clinical Impression  Pt is an 84 year old female who lives in a one story home alone.  She is independent with mobility with use of SPC at baseline and receives assistance with bathing and dressing due to difficulty with sequencing related to dementia.  Pt is in bed and reports no pain at rest upon PT arrival.  She demonstrates intermittent confusion concerning short term memory but is able to follow simple commands.  Pt is able to perform some active LL exercises but is very apprehensive during MMT.  R ankle presents with significant swelling as compared to L.  She requires Max A for bed mobility and is unable to complete supine to sit due to reported pain.  Pt began to become agitated and PT and pt's daughter assisted pt in returning to supine and scooting her up in bed.  PT educated pt's daughter concerning knee immobilizer due to report that pt's knee has been "buckling" and this is suspected to be the cause of pt's recent fall.  Pt's daughter stated understanding that pt is to wear immobilizer when standing only.  PT provided education concerning SNF setting due to pt's daughter being concerned about car transfers and access to the home, as well as transferring pt from her bed to [facility] once in the home.  Pt will continue to benefit from skilled PT with focus on there ex for strength, balance and fall prevention, appropriate use of AD, pain management, bed mobility and safe transfers.    Follow Up Recommendations SNF    Equipment Recommendations  None recommended by PT    Recommendations for Other Services       Precautions / Restrictions Precautions Precautions: Fall Precaution Comments: High  Fall Risk-recent fall Restrictions Weight Bearing Restrictions: Yes RLE Weight Bearing: Weight bearing as tolerated      Mobility  Bed Mobility Overal bed mobility: Needs Assistance Bed Mobility: Supine to Sit;Sit to Supine;Rolling Rolling: Min assist   Supine to sit: Max assist Sit to supine: Max assist   General bed mobility comments: Requires VC's for sequencing as well as manual assist. for moving LE's over EOB.  Pt was unable to sit at EOB due to reported increase in pain level and apprehension regarding R knee flexion.  PT and pt's daughter assisted pt back into bed and scooted up in bed total A.  Transfers Overall transfer level: (Did not perform.  Unable to assess.)                  Ambulation/Gait Ambulation/Gait assistance: (Did not perform.  Unable to assess.)              Stairs            Wheelchair Mobility    Modified Rankin (Stroke Patients Only)       Balance Overall balance assessment: Needs assistance Sitting-balance support: Bilateral upper extremity supported Sitting balance-Leahy Scale: Poor                                       Pertinent Vitals/Pain Pain Assessment: Faces Faces Pain Scale: Hurts whole  lot Pain Location: Reports no pain at rest. Increased pain with attempted active R knee flexion and palpation around knee structure.  R ankle is also tender. Pain Descriptors / Indicators: Sharp Pain Intervention(s): Limited activity within patient's tolerance;Monitored during session    Home Living Family/patient expects to be discharged to:: Private residence Living Arrangements: Alone Available Help at Discharge: Available PRN/intermittently;Personal care attendant Type of Home: House Home Access: Stairs to enter   [employer] of Steps: 3 Home Layout: One level Home Equipment: Environmental consultant - 2 wheels;Cane - single point;Cane - quad;Wheelchair - manual      Prior Function Level of Independence:  Needs assistance   Gait / Transfers Assistance Needed: Uses SPC for household ambulation at baseline  ADL's / Homemaking Assistance Needed: Aide assists with bathing and dressing        Hand Dominance   Dominant Hand: Right    Extremity/Trunk Assessment   Upper Extremity Assessment Upper Extremity Assessment: Generalized weakness    Lower Extremity Assessment Lower Extremity Assessment: Generalized weakness;RLE deficits/detail RLE Deficits / Details: Unable MMT quad strength due to pain.  RN states that pt's knee may have buckled when she fell as pt reports having "bone on bone" OA in R knee.  PT fitted pt for knee immobilizer and educated pt's daughter as to use and instructed in using when pt is standing only. RLE: Unable to fully assess due to pain RLE Coordination: WNL    Cervical / Trunk Assessment Cervical / Trunk Assessment: Kyphotic  Communication      Cognition Arousal/Alertness: Awake/alert Behavior During Therapy: Restless Overall Cognitive Status: Within Functional Limits for tasks assessed                                 General Comments: Pt pleasant during evaluation but began to become agitated with further attempted bed mobility.      General Comments      Exercises General Exercises - Lower Extremity Ankle Circles/Pumps: Both;10 reps;Supine;AROM Hip ABduction/ADduction: 10 reps;AAROM;Right;Supine Other Exercises Other Exercises: Educated pt's daughter regarding use of knee immobilizer and proper use.   Donned knee immobilizer and removed due to pt choosing to stay in bed. Other Exercises: Educated pt's daughter concerning nature of SNF setting and what to expect regarding DC planning as well as transfer training and use of AD.   Assessment/Plan    PT Assessment Patient needs continued PT services  PT Problem List Decreased strength;Decreased mobility;Decreased range of motion;Decreased activity tolerance;Pain       PT Treatment  Interventions DME instruction;Therapeutic activities;Cognitive remediation;Gait training;Therapeutic exercise;Patient/family education;Stair training;Wheelchair mobility training;Functional mobility training;Neuromuscular re-education;Balance training    PT Goals (Current goals can be found in the Care Plan section)  Acute Rehab PT Goals Patient Stated Goal: To be able to walk with cane again and manage pain. PT Goal Formulation: With patient/family Time For Goal Achievement: 04/17/18 Potential to Achieve Goals: Good    Frequency Min 2X/week   Barriers to discharge        Co-evaluation               AM-PAC PT "6 Clicks" Daily Activity  Outcome Measure Difficulty turning over in bed (including adjusting bedclothes, sheets and blankets)?: A Lot Difficulty moving from lying on back to sitting on the side of the bed? : A Lot Difficulty sitting down on and standing up from a chair with arms (e.g., wheelchair, bedside commode, etc,.)?: A Lot  Help needed moving to and from a bed to chair (including a wheelchair)?: Total Help needed walking in hospital room?: Total Help needed climbing 3-5 steps with a railing? : Total 6 Click Score: 9    End of Session Equipment Utilized During Treatment: Oxygen Activity Tolerance: Patient limited by pain Patient left: in bed;with call bell/phone within reach;with bed alarm set;with family/visitor present Nurse Communication: Mobility status PT Visit Diagnosis: Muscle weakness (generalized) (M62.81);History of falling (Z91.81);Pain Pain - Right/Left: Right Pain - part of body: Leg    Time: 0900-0930 PT Time Calculation (min) (ACUTE ONLY): 30 min   Charges:   PT Evaluation $PT Eval Moderate Complexity: 1 Mod PT Treatments $Therapeutic Activity: 8-22 mins   PT G Codes:   PT G-Codes **NOT FOR INPATIENT CLASS** Functional Assessment Tool Used: AM-PAC 6 Clicks Basic Mobility    Roxanne Gates, PT, DPT   Roxanne Gates 03/27/2018, 10:12  AM

## 2018-03-27 NOTE — Progress Notes (Signed)
[facility] [place] NAME: Erin Shannon    MR#:  [id number]  DATE OF BIRTH:  12-25-1926  SUBJECTIVE:   Patient here with fall  REVIEW OF SYSTEMS:    dementia    Tolerating Diet:yes      DRUG ALLERGIES:  No Known Allergies  VITALS:  Blood pressure (!) 138/58, pulse 76, temperature 97.8 F (36.6 C), temperature source Oral, resp. rate 18, height 4\' 11"  (1.499 m), weight 65.8 kg (145 lb), SpO2 95 %.  PHYSICAL EXAMINATION:  Constitutional: Appears well-developed and well-nourished. No distress. HENT: Normocephalic. Marland Kitchen Oropharynx is clear and moist.  Eyes: Conjunctivae and EOM are normal. PERRLA, no scleral icterus.  Neck: Normal ROM. Neck supple. No JVD. No tracheal deviation. CVS: RRR, S1/S2 +, no murmurs, no gallops, no carotid bruit.  Pulmonary: Effort and breath sounds normal, no stridor, rhonchi, wheezes, rales.  Abdominal: Soft. BS +,  no distension, tenderness, rebound or guarding.  Musculoskeletal: right ankle swelling  Neuro: Alert. CN 2-12 grossly intact. No focal deficits. Skin: Skin is warm and dry. No rash noted. Psychiatric: Normal mood and affect.      LABORATORY PANEL:   CBC Recent Labs  Lab 03/27/18 0501  WBC 12.2*  HGB 13.1  HCT 38.3  PLT 202   ------------------------------------------------------------------------------------------------------------------  Chemistries  Recent Labs  Lab 03/26/18 1533 03/27/18 0501  NA 137 140  K 4.7 4.3  CL 99* 106  CO2 30 27  GLUCOSE 140* 104*  BUN 24* 24*  CREATININE 1.05* 0.99  CALCIUM 9.0 8.4*  AST 43*  --   ALT 26  --   ALKPHOS 64  --   BILITOT 0.9  --    ------------------------------------------------------------------------------------------------------------------  Cardiac Enzymes Recent Labs  Lab 03/26/18 1533  TROPONINI 0.03*    ------------------------------------------------------------------------------------------------------------------  RADIOLOGY:  Dg Chest 1 View  Result Date: 03/27/2018 CLINICAL DATA:  Hypoxia. Altered mental status. Pertinent history of breast carcinoma. EXAM: CHEST  1 VIEW COMPARISON:  03/26/2018 FINDINGS: Heart size remains within normal limits. Small left pleural effusion and mild left basilar atelectasis show no significant change. Right lung is clear. Previous right mastectomy and axillary lymph node dissection. IMPRESSION: Stable small left pleural effusion and mild left basilar atelectasis. Electronically Signed   By: Earle Gell M.D.   On: 03/27/2018 09:01   Dg Ankle Complete Right  Result Date: 03/26/2018 CLINICAL DATA:  Status post fall several days ago.  Pain. EXAM: RIGHT ANKLE - COMPLETE 3+ VIEW COMPARISON:  None. FINDINGS: Marked soft tissue swelling in the ankle, lateral greater than medial. Vascular calcifications. No acute fractures are seen. No other acute abnormalities. IMPRESSION: Soft tissue swelling.  No identified fracture. Electronically Signed   By: Dorise Bullion III M.D   On: 03/26/2018 16:34   Mr Ankle Right Wo Contrast  Result Date: 03/26/2018 CLINICAL DATA:  Right ankle pain after fall on Monday. EXAM: MRI OF THE RIGHT ANKLE WITHOUT CONTRAST TECHNIQUE: Multiplanar, multisequence MR imaging of the ankle was performed. No intravenous contrast was administered. COMPARISON:  None. FINDINGS: TENDONS Peroneal: Intact peroneus longus and peroneus brevis tendons. Posteromedial: Intact tibialis posterior, flexor hallucis longus and flexor digitorum longus tendons. Anterior: Intact tibialis anterior, extensor hallucis longus and extensor digitorum longus tendons. Achilles: Intact. Plantar Fascia: Intact. LIGAMENTS Lateral: Intact Medial: Intact CARTILAGE Ankle Joint: No focal chondral defect. Small posterior joint effusion. Subtalar Joints/Sinus Tarsi: Intact Bones: No marrow  edema or fracture. No joint dislocation. Calcaneal enthesopathy along the plantar aspect.  Other: Diffuse periarticular soft tissue swelling of the ankle and included foot. IMPRESSION: Soft tissue edema of the ankle and foot. No fracture or malalignment. Intact tendons and ligaments crossing the ankle joint. Electronically Signed   By: Ashley Royalty M.D.   On: 03/26/2018 20:40   US Venous Img Lower Unilateral Right  Result Date: 03/26/2018 CLINICAL DATA:  Fall.  Edema. EXAM: RIGHT LOWER EXTREMITY VENOUS DOPPLER ULTRASOUND TECHNIQUE: Gray-scale sonography with graded compression, as well as color Doppler and duplex ultrasound were performed to evaluate the lower extremity deep venous systems from the level of the common femoral vein and including the common femoral, femoral, profunda femoral, popliteal and calf veins including the posterior tibial, peroneal and gastrocnemius veins when visible. The superficial great saphenous vein was also interrogated. Spectral Doppler was utilized to evaluate flow at rest and with distal augmentation maneuvers in the common femoral, femoral and popliteal veins. COMPARISON:  None. FINDINGS: Contralateral Common Femoral Vein: Respiratory phasicity is normal and symmetric with the symptomatic side. No evidence of thrombus. Normal compressibility. Common Femoral Vein: No evidence of thrombus. Normal compressibility, respiratory phasicity and response to augmentation. Saphenofemoral Junction: No evidence of thrombus. Normal compressibility and flow on color Doppler imaging. Profunda Femoral Vein: No evidence of thrombus. Normal compressibility and flow on color Doppler imaging. Femoral Vein: No evidence of thrombus. Normal compressibility, respiratory phasicity and response to augmentation. Popliteal Vein: No evidence of thrombus. Normal compressibility, respiratory phasicity and response to augmentation. Calf Veins: No evidence of thrombus. Normal compressibility and flow on color  Doppler imaging. Superficial Great Saphenous Vein: No evidence of thrombus. Normal compressibility. Venous Reflux:  None. Other Findings: There is a Engineer, agricultural cyst measuring 6.2 x 2.1 x 4.0 cm. IMPRESSION: 6.2 x 2.1 x 4.0 cm right popliteal Baker's cyst.  No DVT. Electronically Signed   By: Dorise Bullion III M.D   On: 03/26/2018 16:37   Dg Chest Port 1 View  Result Date: 03/26/2018 CLINICAL DATA:  Status post fall.  Pain. EXAM: PORTABLE CHEST 1 VIEW COMPARISON:  December 23, 2016 FINDINGS: Small left effusion with atelectasis. The heart, hila, mediastinum, lungs, and pleura are otherwise normal. Degenerative changes in the shoulders. IMPRESSION: Small left effusion with underlying opacity, likely atelectasis. No other acute abnormality. Electronically Signed   By: Dorise Bullion III M.D   On: 03/26/2018 16:35   Dg Knee Complete 4 Views Right  Result Date: 03/26/2018 CLINICAL DATA:  Pain after fall EXAM: RIGHT KNEE - COMPLETE 4+ VIEW COMPARISON:  None. FINDINGS: Severe tricompartmental degenerative changes. No acute fractures are seen. No joint effusion noted. Chondrocalcinosis. IMPRESSION: Chondrocalcinosis consistent with CPPD. Severe degenerative changes. No fracture or effusion. Electronically Signed   By: Dorise Bullion III M.D   On: 03/26/2018 16:35     ASSESSMENT AND PLAN:   84 year old female with a history of dementia who presented after fall and right ankle swelling and pain.  1.  Right ankle swelling and pain which is not due to acute fracture as per MRI.  Patient will continue with physical therapy as recommended and elevating leg with ice and pain control with ibuprofen.  2.  UTI: Patient will be discharged on antibiotics.  We asked her PCP to follow-up on final urine culture   3.  Dementia: Continue Aricept and Namenda  4.  Hyperlipidemia: Continue statin  5.  Essential hypertension: Continue Cartia XT        Management plans discussed with the patient's  daughter and she is in agreement.  CODE STATUS: dnr  TOTAL TIME TAKING CARE OF THIS PATIENT: 30 minutes.     POSSIBLE D/C today pending insurance,    Jabril Pursell M.D on 03/27/2018 at 11:36 AM  Between 7am to 6pm - Pager - [phone] After 6pm go to [email or web address] - password [email or web address] [facility]  Office  [phone]  CC: Primary care physician; [name], MD  Note: This dictation was prepared with Dragon dictation along with smaller phrase technology. Any transcriptional errors that result from this process are unintentional.

## 2018-03-27 NOTE — Consult Note (Signed)
ORTHOPAEDIC CONSULTATION  REQUESTING PHYSICIAN: Bettey Costa, MD  Chief Complaint: Pain right knee and ankle  HPI: Erin Shannon is a 84 y.o. female who complains of pain in the right knee and ankle.  She was admitted for urinary tract infection yesterday.  Apparently fell 6 days ago on 03/21/2018.  She had increased pain in the right ankle and knee.  She lives at home with a caregiver coming in daily to see her.  X-rays in the emergency room showed severe arthritis of the right knee with no acute injury and a normal right ankle.  MRI of the ankle was unremarkable.  She is being readied for discharge to skilled nursing facilities.  Past Medical History:  Diagnosis Date  . Arrhythmia   . Breast cancer [facility])    s/p mastectomy  . Diastolic dysfunction    echo 11/07. EF 60-65%. mild to moderate PI  . GERD (gastroesophageal reflux disease)   . HTN (hypertension)   . Hyperlipidemia   . Memory loss   . Osteoarthritis (arthritis due to wear and tear of joints)   . Palpitations    monitor with PACs/PVCs. neg myoview 2007  . Syncope and collapse    Past Surgical History:  Procedure Laterality Date  . hysterectomy-unspecified area    . masectomy     R   Social History   Socioeconomic History  . Marital status: Widowed    Spouse name: Not on file  . Number of children: Not on file  . Years of education: Not on file  . Highest education level: Not on file  Occupational History  . Not on file  Social Needs  . Financial resource strain: Not on file  . Food insecurity:    Worry: Not on file    Inability: Not on file  . Transportation needs:    Medical: Not on file    Non-medical: Not on file  Tobacco Use  . Smoking status: Never Smoker  . Smokeless tobacco: Never Used  . Tobacco comment: tobacco use -no  Substance and Sexual Activity  . Alcohol use: No  . Drug use: No  . Sexual activity: Not on file  Lifestyle  . Physical activity:    Days per week: Not on file    Minutes  per session: Not on file  . Stress: Not on file  Relationships  . Social connections:    Talks on phone: Not on file    Gets together: Not on file    Attends religious service: Not on file    Active member of club or organization: Not on file    Attends meetings of clubs or organizations: Not on file    Relationship status: Not on file  Other Topics Concern  . Not on file  Social History Narrative   Widowed, retired, gets regular exercise-works in yard.   Family History  Problem Relation Age of Onset  . Heart failure Father   . Heart disease Brother    No Known Allergies Prior to Admission medications   Medication Sig Start Date End Date Taking? Authorizing Provider  alendronate (FOSAMAX) 70 MG tablet Take 70 mg by mouth every Monday. Take with a full glass of water on an empty stomach.    Yes [provider]  Calcium Citrate-Vitamin D (CALCIUM CITRATE + D PO) Take 1 tablet by mouth every evening. 630mg /500    Yes [provider]  CARTIA XT 240 MG 24 hr capsule TAKE 1 CAPSULE BY MOUTH ONCE  DAILY 12/17/17  Yes Wellington Hampshire, MD  celecoxib (CELEBREX) 200 MG capsule Take 1 capsule by mouth daily. 05/22/15  Yes [provider]  donepezil (ARICEPT) 10 MG tablet Take 10 mg by mouth every evening.    Yes [provider]  memantine (NAMENDA) 10 MG tablet Take 10 mg by mouth 2 (two) times daily.     Yes [provider]  Multiple Vitamins-Minerals (COMPLETE WOMENS) TABS Take 1 tablet by mouth daily.     Yes [provider]  pravastatin (PRAVACHOL) 40 MG tablet Take 40 mg by mouth every evening.    Yes [provider]  traMADol (ULTRAM) 50 MG tablet Take 50 mg by mouth every 6 (six) hours as needed for severe pain.   Yes [provider]  cephALEXin (KEFLEX) 250 MG capsule Take 1 capsule (250 mg total) by mouth 3 (three) times daily for 4 days. 03/27/18 03/31/18  Bettey Costa, MD  ibuprofen (ADVIL,MOTRIN) 400 MG tablet Take 1  tablet (400 mg total) by mouth every 6 (six) hours as needed for fever, headache, moderate pain or cramping. 03/27/18   Bettey Costa, MD   Dg Chest 1 View  Result Date: 03/27/2018 CLINICAL DATA:  Hypoxia. Altered mental status. Pertinent history of breast carcinoma. EXAM: CHEST  1 VIEW COMPARISON:  03/26/2018 FINDINGS: Heart size remains within normal limits. Small left pleural effusion and mild left basilar atelectasis show no significant change. Right lung is clear. Previous right mastectomy and axillary lymph node dissection. IMPRESSION: Stable small left pleural effusion and mild left basilar atelectasis. Electronically Signed   By: Earle Gell M.D.   On: 03/27/2018 09:01   Dg Ankle Complete Right  Result Date: 03/26/2018 CLINICAL DATA:  Status post fall several days ago.  Pain. EXAM: RIGHT ANKLE - COMPLETE 3+ VIEW COMPARISON:  None. FINDINGS: Marked soft tissue swelling in the ankle, lateral greater than medial. Vascular calcifications. No acute fractures are seen. No other acute abnormalities. IMPRESSION: Soft tissue swelling.  No identified fracture. Electronically Signed   By: Dorise Bullion III M.D   On: 03/26/2018 16:34   Mr Ankle Right Wo Contrast  Result Date: 03/26/2018 CLINICAL DATA:  Right ankle pain after fall on Monday. EXAM: MRI OF THE RIGHT ANKLE WITHOUT CONTRAST TECHNIQUE: Multiplanar, multisequence MR imaging of the ankle was performed. No intravenous contrast was administered. COMPARISON:  None. FINDINGS: TENDONS Peroneal: Intact peroneus longus and peroneus brevis tendons. Posteromedial: Intact tibialis posterior, flexor hallucis longus and flexor digitorum longus tendons. Anterior: Intact tibialis anterior, extensor hallucis longus and extensor digitorum longus tendons. Achilles: Intact. Plantar Fascia: Intact. LIGAMENTS Lateral: Intact Medial: Intact CARTILAGE Ankle Joint: No focal chondral defect. Small posterior joint effusion. Subtalar Joints/Sinus Tarsi: Intact Bones: No  marrow edema or fracture. No joint dislocation. Calcaneal enthesopathy along the plantar aspect. Other: Diffuse periarticular soft tissue swelling of the ankle and included foot. IMPRESSION: Soft tissue edema of the ankle and foot. No fracture or malalignment. Intact tendons and ligaments crossing the ankle joint. Electronically Signed   By: Ashley Royalty M.D.   On: 03/26/2018 20:40   US Venous Img Lower Unilateral Right  Result Date: 03/26/2018 CLINICAL DATA:  Fall.  Edema. EXAM: RIGHT LOWER EXTREMITY VENOUS DOPPLER ULTRASOUND TECHNIQUE: Gray-scale sonography with graded compression, as well as color Doppler and duplex ultrasound were performed to evaluate the lower extremity deep venous systems from the level of the common femoral vein and including the common femoral, femoral, profunda femoral, popliteal and calf veins including the  posterior tibial, peroneal and gastrocnemius veins when visible. The superficial great saphenous vein was also interrogated. Spectral Doppler was utilized to evaluate flow at rest and with distal augmentation maneuvers in the common femoral, femoral and popliteal veins. COMPARISON:  None. FINDINGS: Contralateral Common Femoral Vein: Respiratory phasicity is normal and symmetric with the symptomatic side. No evidence of thrombus. Normal compressibility. Common Femoral Vein: No evidence of thrombus. Normal compressibility, respiratory phasicity and response to augmentation. Saphenofemoral Junction: No evidence of thrombus. Normal compressibility and flow on color Doppler imaging. Profunda Femoral Vein: No evidence of thrombus. Normal compressibility and flow on color Doppler imaging. Femoral Vein: No evidence of thrombus. Normal compressibility, respiratory phasicity and response to augmentation. Popliteal Vein: No evidence of thrombus. Normal compressibility, respiratory phasicity and response to augmentation. Calf Veins: No evidence of thrombus. Normal compressibility and flow on  color Doppler imaging. Superficial Great Saphenous Vein: No evidence of thrombus. Normal compressibility. Venous Reflux:  None. Other Findings: There is a Engineer, agricultural cyst measuring 6.2 x 2.1 x 4.0 cm. IMPRESSION: 6.2 x 2.1 x 4.0 cm right popliteal Baker's cyst.  No DVT. Electronically Signed   By: Dorise Bullion III M.D   On: 03/26/2018 16:37   Dg Chest Port 1 View  Result Date: 03/26/2018 CLINICAL DATA:  Status post fall.  Pain. EXAM: PORTABLE CHEST 1 VIEW COMPARISON:  December 23, 2016 FINDINGS: Small left effusion with atelectasis. The heart, hila, mediastinum, lungs, and pleura are otherwise normal. Degenerative changes in the shoulders. IMPRESSION: Small left effusion with underlying opacity, likely atelectasis. No other acute abnormality. Electronically Signed   By: Dorise Bullion III M.D   On: 03/26/2018 16:35   Dg Knee Complete 4 Views Right  Result Date: 03/26/2018 CLINICAL DATA:  Pain after fall EXAM: RIGHT KNEE - COMPLETE 4+ VIEW COMPARISON:  None. FINDINGS: Severe tricompartmental degenerative changes. No acute fractures are seen. No joint effusion noted. Chondrocalcinosis. IMPRESSION: Chondrocalcinosis consistent with CPPD. Severe degenerative changes. No fracture or effusion. Electronically Signed   By: Dorise Bullion III M.D   On: 03/26/2018 16:35    Positive ROS: All other systems have been reviewed and were otherwise negative with the exception of those mentioned in the HPI and as above.  Physical Exam: General: Alert, no acute distress Cardiovascular: No pedal edema Respiratory: No cyanosis, no use of accessory musculature GI: No organomegaly, abdomen is soft and non-tender Skin: No lesions in the area of chief complaint Neurologic: Sensation intact distally Psychiatric: Patient is competent for consent with normal mood and affect Lymphatic: No axillary or cervical lymphadenopathy  MUSCULOSKELETAL: The patient is somnolent but arousable.  The right hip has good motion  without pain.  The right knee is mildly swollen without significant warmth.  Range of motion is very painful from 0 to 40 degrees.  Right ankle is mildly swollen.  There is very little tenderness and range of motion is good.  Neurovascular status is good in the lower extremity.  The skin is intact.  Assessment: Severe right knee arthritis/sprain Right ankle sprain  Plan: 6 inch Ace bandage and knee immobilizer for the knee. 4 inch Ace bandage for the right ankle.  PT to ambulate weightbearing as tolerated with a walker. Ice to the knee as necessary  Restart Celebrex or other NSAIDs if it is felt safe to do so.  Follow-up appointment my office in 1 week.      Park Breed, MD [phone]   03/27/2018 1:08 PM

## 2018-03-27 NOTE — Discharge Summary (Addendum)
Erin Shannon NAME: Erin Shannon    MR#:  [id number]  DATE OF BIRTH:  [birth year]  DATE OF ADMISSION:  03/26/2018 ADMITTING PHYSICIAN: Vaughan Basta, MD  DATE OF DISCHARGE: 03/27/2018  PRIMARY CARE PHYSICIAN: [name], MD    ADMISSION DIAGNOSIS:  Fall, initial encounter [W19.XXXA] Urinary tract infection without hematuria, site unspecified [N39.0] Acute right ankle pain [M25.571]  DISCHARGE DIAGNOSIS:  Principal Problem:   Acute lower UTI Active Problems:   UTI (urinary tract infection)   SECONDARY DIAGNOSIS:   Past Medical History:  Diagnosis Date  . Arrhythmia   . Breast cancer [facility])    s/p mastectomy  . Diastolic dysfunction    echo 11/07. EF 60-65%. mild to moderate PI  . GERD (gastroesophageal reflux disease)   . HTN (hypertension)   . Hyperlipidemia   . Memory loss   . Osteoarthritis (arthritis due to wear and tear of joints)   . Palpitations    monitor with PACs/PVCs. neg myoview 2007  . Syncope and collapse     HOSPITAL COURSE:  84 year old female with a history of dementia who presented after fall and right ankle swelling and pain.  1.  Right ankle swelling and pain which is not due to acute fracture as per MRI.  Patient will continue with physical therapy as recommended and elevating leg with ice and pain control with ibuprofen. Evaluated by orthopedic surgery who is recommending 4 inch Ace bandage for the right ankle.  PT to ambulate weightbearing as tolerated with a walker.   2.  E. coli UTI: E. coli is only resistant to ampicillin, Bactrim and Augmentin.  She will be discharged on oral Keflex   3.  Dementia: Continue Aricept and Namenda  4.  Hyperlipidemia: Continue statin  5.  Essential hypertension: Continue Cartia XT  6.  Severe right knee arthritis/sprain: 6 inch Ace bandage and knee immobilizer for the knee.    DISCHARGE CONDITIONS AND DIET:   Stable for discharge on  regular diet  CONSULTS OBTAINED:  Treatment Team:  Earnestine Leys, MD  DRUG ALLERGIES:  No Known Allergies  DISCHARGE MEDICATIONS:   Allergies as of 03/28/2018   No Known Allergies     Medication List    STOP taking these medications   diltiazem 120 MG tablet Commonly known as:  CARDIZEM   traMADol 50 MG tablet Commonly known as:  ULTRAM     TAKE these medications   alendronate 70 MG tablet Commonly known as:  FOSAMAX Take 70 mg by mouth every Monday. Take with a full glass of water on an empty stomach.   CALCIUM CITRATE + D PO Take 1 tablet by mouth every evening. 630mg /500   celecoxib 200 MG capsule Commonly known as:  CELEBREX Take 1 capsule (200 mg total) by mouth daily.   cephALEXin 250 MG capsule Commonly known as:  KEFLEX Take 1 capsule (250 mg total) by mouth 3 (three) times daily for 4 days.   COMPLETE WOMENS Tabs Take 1 tablet by mouth daily.   donepezil 10 MG tablet Commonly known as:  ARICEPT Take 10 mg by mouth every evening.   ibuprofen 400 MG tablet Commonly known as:  ADVIL,MOTRIN Take 1 tablet (400 mg total) by mouth every 6 (six) hours as needed for fever, headache, moderate pain or cramping.   memantine 10 MG tablet Commonly known as:  NAMENDA Take 10 mg by mouth 2 (two) times daily.   pravastatin 40 MG tablet Commonly  known as:  PRAVACHOL Take 40 mg by mouth every evening.         Today   CHIEF COMPLAINT:  No acute events overnight   VITAL SIGNS:  Blood pressure (!) 173/96, pulse 81, temperature 98.4 F (36.9 C), resp. rate 20, height 4\' 11"  (1.499 m), weight 65.8 kg (145 lb), SpO2 93 %.   REVIEW OF SYSTEMS:  Review of Systems  Unable to perform ROS: Dementia     PHYSICAL EXAMINATION:  GENERAL:  84 y.o.-year-old patient lying in the bed with no acute distress.  NECK:  Supple, no jugular venous distention. No thyroid enlargement, no tenderness.  LUNGS: Normal breath sounds bilaterally, no wheezing, rales,rhonchi   No use of accessory muscles of respiration.  CARDIOVASCULAR: S1, S2 normal. No murmurs, rubs, or gallops.  ABDOMEN: Soft, non-tender, non-distended. Bowel sounds present. No organomegaly or mass.  EXTREMITIES: Right ankle with some swelling no tenderness at joint line  pSYCHIATRIC: The patient is alert and oriented x name SKIN: No obvious rash, lesion, or ulcer.   DATA REVIEW:   CBC Recent Labs  Lab 03/27/18 0501  WBC 12.2*  HGB 13.1  HCT 38.3  PLT 202    Chemistries  Recent Labs  Lab 03/26/18 1533 03/27/18 0501  NA 137 140  K 4.7 4.3  CL 99* 106  CO2 30 27  GLUCOSE 140* 104*  BUN 24* 24*  CREATININE 1.05* 0.99  CALCIUM 9.0 8.4*  AST 43*  --   ALT 26  --   ALKPHOS 64  --   BILITOT 0.9  --     Cardiac Enzymes Recent Labs  Lab 03/26/18 1533  TROPONINI 0.03*    Microbiology Results  @MICRORSLT48 @  RADIOLOGY:  Dg Chest 1 View  Result Date: 03/27/2018 CLINICAL DATA:  Hypoxia. Altered mental status. Pertinent history of breast carcinoma. EXAM: CHEST  1 VIEW COMPARISON:  03/26/2018 FINDINGS: Heart size remains within normal limits. Small left pleural effusion and mild left basilar atelectasis show no significant change. Right lung is clear. Previous right mastectomy and axillary lymph node dissection. IMPRESSION: Stable small left pleural effusion and mild left basilar atelectasis. Electronically Signed   By: Earle Gell M.D.   On: 03/27/2018 09:01   Dg Ankle Complete Right  Result Date: 03/26/2018 CLINICAL DATA:  Status post fall several days ago.  Pain. EXAM: RIGHT ANKLE - COMPLETE 3+ VIEW COMPARISON:  None. FINDINGS: Marked soft tissue swelling in the ankle, lateral greater than medial. Vascular calcifications. No acute fractures are seen. No other acute abnormalities. IMPRESSION: Soft tissue swelling.  No identified fracture. Electronically Signed   By: Dorise Bullion III M.D   On: 03/26/2018 16:34   Mr Ankle Right Wo Contrast  Result Date: 03/26/2018 CLINICAL  DATA:  Right ankle pain after fall on Monday. EXAM: MRI OF THE RIGHT ANKLE WITHOUT CONTRAST TECHNIQUE: Multiplanar, multisequence MR imaging of the ankle was performed. No intravenous contrast was administered. COMPARISON:  None. FINDINGS: TENDONS Peroneal: Intact peroneus longus and peroneus brevis tendons. Posteromedial: Intact tibialis posterior, flexor hallucis longus and flexor digitorum longus tendons. Anterior: Intact tibialis anterior, extensor hallucis longus and extensor digitorum longus tendons. Achilles: Intact. Plantar Fascia: Intact. LIGAMENTS Lateral: Intact Medial: Intact CARTILAGE Ankle Joint: No focal chondral defect. Small posterior joint effusion. Subtalar Joints/Sinus Tarsi: Intact Bones: No marrow edema or fracture. No joint dislocation. Calcaneal enthesopathy along the plantar aspect. Other: Diffuse periarticular soft tissue swelling of the ankle and included foot. IMPRESSION: Soft tissue edema of the ankle and  foot. No fracture or malalignment. Intact tendons and ligaments crossing the ankle joint. Electronically Signed   By: Ashley Royalty M.D.   On: 03/26/2018 20:40   US Venous Img Lower Unilateral Right  Result Date: 03/26/2018 CLINICAL DATA:  Fall.  Edema. EXAM: RIGHT LOWER EXTREMITY VENOUS DOPPLER ULTRASOUND TECHNIQUE: Gray-scale sonography with graded compression, as well as color Doppler and duplex ultrasound were performed to evaluate the lower extremity deep venous systems from the level of the common femoral vein and including the common femoral, femoral, profunda femoral, popliteal and calf veins including the posterior tibial, peroneal and gastrocnemius veins when visible. The superficial great saphenous vein was also interrogated. Spectral Doppler was utilized to evaluate flow at rest and with distal augmentation maneuvers in the common femoral, femoral and popliteal veins. COMPARISON:  None. FINDINGS: Contralateral Common Femoral Vein: Respiratory phasicity is normal and  symmetric with the symptomatic side. No evidence of thrombus. Normal compressibility. Common Femoral Vein: No evidence of thrombus. Normal compressibility, respiratory phasicity and response to augmentation. Saphenofemoral Junction: No evidence of thrombus. Normal compressibility and flow on color Doppler imaging. Profunda Femoral Vein: No evidence of thrombus. Normal compressibility and flow on color Doppler imaging. Femoral Vein: No evidence of thrombus. Normal compressibility, respiratory phasicity and response to augmentation. Popliteal Vein: No evidence of thrombus. Normal compressibility, respiratory phasicity and response to augmentation. Calf Veins: No evidence of thrombus. Normal compressibility and flow on color Doppler imaging. Superficial Great Saphenous Vein: No evidence of thrombus. Normal compressibility. Venous Reflux:  None. Other Findings: There is a Engineer, agricultural cyst measuring 6.2 x 2.1 x 4.0 cm. IMPRESSION: 6.2 x 2.1 x 4.0 cm right popliteal Baker's cyst.  No DVT. Electronically Signed   By: Dorise Bullion III M.D   On: 03/26/2018 16:37   Dg Chest Port 1 View  Result Date: 03/26/2018 CLINICAL DATA:  Status post fall.  Pain. EXAM: PORTABLE CHEST 1 VIEW COMPARISON:  December 23, 2016 FINDINGS: Small left effusion with atelectasis. The heart, hila, mediastinum, lungs, and pleura are otherwise normal. Degenerative changes in the shoulders. IMPRESSION: Small left effusion with underlying opacity, likely atelectasis. No other acute abnormality. Electronically Signed   By: Dorise Bullion III M.D   On: 03/26/2018 16:35   Dg Knee Complete 4 Views Right  Result Date: 03/26/2018 CLINICAL DATA:  Pain after fall EXAM: RIGHT KNEE - COMPLETE 4+ VIEW COMPARISON:  None. FINDINGS: Severe tricompartmental degenerative changes. No acute fractures are seen. No joint effusion noted. Chondrocalcinosis. IMPRESSION: Chondrocalcinosis consistent with CPPD. Severe degenerative changes. No fracture or effusion.  Electronically Signed   By: Dorise Bullion III M.D   On: 03/26/2018 16:35      Allergies as of 03/28/2018   No Known Allergies     Medication List    STOP taking these medications   diltiazem 120 MG tablet Commonly known as:  CARDIZEM   traMADol 50 MG tablet Commonly known as:  ULTRAM     TAKE these medications   alendronate 70 MG tablet Commonly known as:  FOSAMAX Take 70 mg by mouth every Monday. Take with a full glass of water on an empty stomach.   CALCIUM CITRATE + D PO Take 1 tablet by mouth every evening. 630mg /500   celecoxib 200 MG capsule Commonly known as:  CELEBREX Take 1 capsule (200 mg total) by mouth daily.   cephALEXin 250 MG capsule Commonly known as:  KEFLEX Take 1 capsule (250 mg total) by mouth 3 (three) times daily for 4 days.  COMPLETE WOMENS Tabs Take 1 tablet by mouth daily.   donepezil 10 MG tablet Commonly known as:  ARICEPT Take 10 mg by mouth every evening.   ibuprofen 400 MG tablet Commonly known as:  ADVIL,MOTRIN Take 1 tablet (400 mg total) by mouth every 6 (six) hours as needed for fever, headache, moderate pain or cramping.   memantine 10 MG tablet Commonly known as:  NAMENDA Take 10 mg by mouth 2 (two) times daily.   pravastatin 40 MG tablet Commonly known as:  PRAVACHOL Take 40 mg by mouth every evening.         Management plans discussed with the patient's daughter and she is in agreement. Stable for discharge  Patient should follow up with pcp  CODE STATUS:     Code Status Orders  (From admission, onward)        Start     Ordered   03/26/18 2047  Do not attempt resuscitation (DNR)  Continuous    Question Answer Comment  In the event of cardiac or respiratory ARREST Do not call a "code blue"   In the event of cardiac or respiratory ARREST Do not perform Intubation, CPR, defibrillation or ACLS   In the event of cardiac or respiratory ARREST Use medication by any route, position, wound care, and other  measures to relive pain and suffering. May use oxygen, suction and manual treatment of airway obstruction as needed for comfort.      03/26/18 2046    Code Status History    Date Active Date Inactive Code Status Order ID Comments User Context   12/23/2016 2202 12/25/2016 2121 Full Code [id number]  Demetrios Loll, MD ED    Advance Directive Documentation     Most Recent Value  Type of Advance Directive  Healthcare Power of [place], Living will  Pre-existing out of facility DNR order (yellow form or pink MOST form)  -  "MOST" Form in Place?  -      TOTAL TIME TAKING CARE OF THIS PATIENT: 38 minutes.    Note: This dictation was prepared with Dragon dictation along with smaller phrase technology. Any transcriptional errors that result from this process are unintentional.  Eshan Trupiano M.D on 03/28/2018 at 9:07 AM  Between 7am to 6pm - Pager - [phone] After 6pm go to [email or web address] - password [email or web address] [facility]  Office  [phone]  CC: Primary care physician; [name], MD

## 2018-03-27 NOTE — Clinical Social Work Note (Signed)
Clinical Social Work Assessment  Patient Details  Name: Erin Shannon MRN: [id number] Date of Birth: 07/08/1927  Date of referral:  03/27/18               Reason for consult:  Facility Placement                Permission sought to share information with:  Chartered certified accountant granted to share information::  Yes, Verbal Permission Granted  Name::        Agency::  [place]  Relationship::     Contact Information:     Housing/Transportation Living arrangements for the past 2 months:  [place] of Information:  Medical Team, Adult Children Patient Interpreter Needed:  None Criminal Activity/Legal Involvement Pertinent to Current Situation/Hospitalization:  No - Comment as needed Significant Relationships:  Adult Children, Community Support Lives with:  Adult Children Do you feel safe going back to the place where you live?  Yes Need for family participation in patient care:  Yes (Comment)(Patient is confused at this time/AMS)  Care giving concerns:  PT recommendation for SNF   Social Worker assessment / plan:  CSW met with the patient's daughter outside of the room to discuss discharge planning. The patient's daughter gave verbal consent to begin the bed search with the exception of [facility] due to past experiences at that facility. The patient's daughter is aware that [facility] will need to give prior authorization for services. The patient will most likely discharge tomorrow pending medical stability and insurance authorization. The family prefer [facility] [facility], [facility], or [place] for placement. The CSW will follow-up with bed offers as available and assist with discharge facilitation. The patient's daughter plans to transport.  Employment status:  Retired Nurse, adult PT Recommendations:  [place] / Referral to  community resources:  Grants  Patient/Family's Response to care:  The patient's daughter thanked the CSW.  Patient/Family's Understanding of and Emotional Response to Diagnosis, Current Treatment, and Prognosis:  The patient's daughter understands and agrees with the discharge plan.  Emotional Assessment Appearance:  Appears stated age Attitude/Demeanor/Rapport:  Lethargic Affect (typically observed):  (The patient was sleeping but stable.) Orientation:  (The patient was sleeping. Baseline oriented to person, place, situation) Alcohol / Substance use:  Never Used Psych involvement (Current and /or in the community):  No (Comment)  Discharge Needs  Concerns to be addressed:  Care Coordination, Discharge Planning Concerns Readmission within the last 30 days:  No Current discharge risk:  Lives alone, Physical Impairment Barriers to Discharge:  [place], LCSW 03/27/2018, 11:59 AM

## 2018-03-27 NOTE — Clinical Social Work Note (Signed)
CSW received a consult for SNF placement. CSW will assess when able.   Santiago Bumpers, MSW, Latanya Presser [phone]

## 2018-03-28 ENCOUNTER — Encounter
Admission: RE | Admit: 2018-03-28 | Discharge: 2018-03-28 | Disposition: A | Discharge status: Home / Self Care | Payer: Medicare Other | Source: Ambulatory Visit | Attending: Internal Medicine | Admitting: Internal Medicine

## 2018-03-28 DIAGNOSIS — M25571 Pain in right ankle and joints of right foot: Secondary | ICD-10-CM | POA: Diagnosis not present

## 2018-03-28 DIAGNOSIS — N390 Urinary tract infection, site not specified: Secondary | ICD-10-CM | POA: Diagnosis not present

## 2018-03-28 LAB — BLOOD GAS, VENOUS
Acid-Base Excess: 3.1 mmol/L — ABNORMAL HIGH (ref 0.0–2.0)
Bicarbonate: 28.5 mmol/L — ABNORMAL HIGH (ref 20.0–28.0)
PATIENT TEMPERATURE: 37
PH VEN: 7.41 (ref 7.250–7.430)
pCO2, Ven: 45 mmHg (ref 44.0–60.0)

## 2018-03-28 LAB — URINE CULTURE

## 2018-03-28 MED ORDER — CELECOXIB 200 MG PO CAPS: 200.0000 mg | ORAL_CAPSULE | Freq: Every day | ORAL | 11 refills | Status: DC

## 2018-03-28 NOTE — Progress Notes (Signed)
Patient discharged via EMS. Christle Nolting S, RN  

## 2018-03-28 NOTE — Progress Notes (Signed)
Patient is medically ready for discharge today. Clinical Social worker (CSW) informed patient and daughter [name] that patient will DC to [facility] today. Patient and daughter are in agreement with this. Daughter has requested that patient be transported by EMS.  Sharyn Lull, Admissions Coordinator at [facility] is aware of above and has received authorization for [facility]. Patient will be transported by EMS. RN to call report and call for transport. CSW signing off. Please re-consult if needs arise.   Annamaria Boots MSW. LCSWA [phone]

## 2018-03-28 NOTE — Clinical Social Work Placement (Signed)
   CLINICAL SOCIAL WORK PLACEMENT  NOTE  Date:  03/28/2018  Patient Details  Name: Erin Shannon MRN: [id number] Date of Birth: June 01, 1927  Clinical Social Work is seeking post-discharge placement for this patient at the [place] level of care (*CSW will initial, date and re-position this form in  chart as items are completed):  Yes   Patient/family provided with [place] Work Department's list of facilities offering this level of care within the geographic area requested by the patient (or if unable, by the patient's family).  Yes   Patient/family informed of their freedom to choose among providers that offer the needed level of care, that participate in Medicare, Medicaid or managed care program needed by the patient, have an available bed and are willing to accept the patient.  Yes   Patient/family informed of [place]'s ownership interest in [facility] and [facility], as well as of the fact that they are under no obligation to receive care at these facilities.  PASRR submitted to EDS on       PASRR number received on       Existing PASRR number confirmed on 03/27/18     FL2 transmitted to all facilities in geographic area requested by pt/family on 03/27/18     FL2 transmitted to all facilities within larger geographic area on       Patient informed that his/her managed care company has contracts with or will negotiate with certain facilities, including the following:        Yes   Patient/family informed of bed offers received.  Patient chooses bed at [facility] )     Physician recommends and patient chooses bed at [facility])    Patient to be transferred to [facility] ) on 03/28/18.  Patient to be transferred to facility by (EMS)     Patient family notified on 03/28/18 of transfer.  Name of family member notified:  [name] - daughter [phone]     PHYSICIAN       Additional Comment:     _______________________________________________ Annamaria Boots, [place] 03/28/2018, 12:05 PM

## 2018-03-28 NOTE — Progress Notes (Signed)
Clinical Social Worker (CSW) met with patient and daughter [name] to discuss bed offers. Patient and daughter chose [facility]. Sharyn Lull, Admissions Coordinator for [facility] is aware and has started [facility] authorization for SNF.   Annamaria Boots MSW [place]  [phone]

## 2018-03-28 NOTE — Progress Notes (Signed)
Physical Therapy Treatment Patient Details Name: Erin Shannon MRN: [id number] DOB: 30-Aug-1927 Today's Date: 03/28/2018    History of Present Illness Patient is an 84 year old female admitted from home s/p fall and acute lower UTI.  PMH includes breast CA, CHF, HLD, Htn, memory loss and arrhythmia.     PT Comments    Pt agreeable to PT; reports pain in RLE and less so in L knee. Pt requires increased encouragement to participate in each exercise/activity, as her initial response is "I can't"; once pt tries, she is pleased that she can. Participates in BLE supine exercises with assist as needed. Mod A for all bed mobility to/from edge of bed. Unable to transfer to stand at this time with 1 person assist (pt can to stand pivot with 2+ assist). Nurse staff in to prepare pt for discharge, so pt returned to supine. Pt to discharge to skilled nursing facility today to continue rehab efforts.    Follow Up Recommendations  SNF     Equipment Recommendations  None recommended by PT    Recommendations for Other Services       Precautions / Restrictions Precautions Precautions: Fall Restrictions Weight Bearing Restrictions: No    Mobility  Bed Mobility Overal bed mobility: Needs Assistance Bed Mobility: Supine to Sit;Sit to Supine;Rolling     Supine to sit: Mod assist Sit to supine: Mod assist   General bed mobility comments: Trunk and LEs  Transfers Overall transfer level: Needs assistance               General transfer comment: Brief attempt with 1 person assist. Pt unable. Nurse in noting calling report for pt transfer. Pt returned to bed to prepare for dischage.   Ambulation/Gait                 Stairs             Wheelchair Mobility    Modified Rankin (Stroke Patients Only)       Balance                                            Cognition Arousal/Alertness: Awake/alert(initially sleeping; easily awoken through  voice) Behavior During Therapy: WFL for tasks assessed/performed Overall Cognitive Status: Within Functional Limits for tasks assessed                                 General Comments: Very reactive to stimuli. Initial reaction to each new ex/activity is "I can't"      Exercises General Exercises - Lower Extremity Ankle Circles/Pumps: AROM;Both;10 reps;Supine(also inver/ever and circles) Quad Sets: Strengthening;Both;10 reps;Supine Gluteal Sets: Strengthening;Both;10 reps;Supine Short Arc Quad: AROM;Left;10 reps;Supine Heel Slides: AAROM;Left;10 reps;Supine Hip ABduction/ADduction: AAROM;Both;10 reps;Supine Straight Leg Raises: AAROM;Both;10 reps;Supine    General Comments        Pertinent Vitals/Pain Pain Assessment: Faces Faces Pain Scale: Hurts even more Pain Location: RLE, also L knee (less so)     Home Living                      Prior Function            PT Goals (current goals can now be found in the care plan section) Progress towards PT goals: Progressing toward goals(slowly)    Frequency  Min 2X/week      PT Plan Current plan remains appropriate    Co-evaluation              AM-PAC PT "6 Clicks" Daily Activity  Outcome Measure  Difficulty turning over in bed (including adjusting bedclothes, sheets and blankets)?: Unable Difficulty moving from lying on back to sitting on the side of the bed? : Unable Difficulty sitting down on and standing up from a chair with arms (e.g., wheelchair, bedside commode, etc,.)?: Unable Help needed moving to and from a bed to chair (including a wheelchair)?: A Lot Help needed walking in hospital room?: A Lot Help needed climbing 3-5 steps with a railing? : Total 6 Click Score: 8    End of Session   Activity Tolerance: Patient limited by fatigue;Patient limited by pain Patient left: in bed;with call bell/phone within reach;with nursing/sitter in room;with family/visitor present   PT  Visit Diagnosis: Muscle weakness (generalized) (M62.81);History of falling (Z91.81);Pain Pain - Right/Left: Right Pain - part of body: Leg     Time: 1142-1207 PT Time Calculation (min) (ACUTE ONLY): 25 min  Charges:  $Therapeutic Exercise: 8-22 mins $Therapeutic Activity: 8-22 mins                    G Codes:        Larae Grooms, PTA 03/28/2018, 12:18 PM

## 2018-03-28 NOTE — Progress Notes (Signed)
Report called to [place] at [facility]. EMS called for transport. Madlyn Frankel, RN

## 2018-03-30 ENCOUNTER — Encounter
Admission: RE | Admit: 2018-03-30 | Discharge: 2018-03-30 | Disposition: A | Discharge status: Home / Self Care | Payer: Medicare Other | Source: Ambulatory Visit | Attending: Internal Medicine | Admitting: Internal Medicine

## 2018-03-31 LAB — CULTURE, BLOOD (ROUTINE X 2)
CULTURE: NO GROWTH
Culture: NO GROWTH
Special Requests: ADEQUATE

## 2018-04-06 ENCOUNTER — Inpatient Hospital Stay: Payer: Medicare Other

## 2018-04-06 ENCOUNTER — Emergency Department: Payer: Medicare Other

## 2018-04-06 ENCOUNTER — Other Ambulatory Visit: Payer: Self-pay

## 2018-04-06 ENCOUNTER — Inpatient Hospital Stay
Admission: EM | Admit: 2018-04-06 | Discharge: 2018-04-07 | DRG: 175 | Disposition: A | Discharge status: Skilled Nursing Facility | Payer: Medicare Other | Attending: Internal Medicine | Admitting: Internal Medicine

## 2018-04-06 DIAGNOSIS — K219 Gastro-esophageal reflux disease without esophagitis: Secondary | ICD-10-CM | POA: Diagnosis present

## 2018-04-06 DIAGNOSIS — D72829 Elevated white blood cell count, unspecified: Secondary | ICD-10-CM | POA: Diagnosis present

## 2018-04-06 DIAGNOSIS — F0390 Unspecified dementia without behavioral disturbance: Secondary | ICD-10-CM | POA: Diagnosis present

## 2018-04-06 DIAGNOSIS — I4430 Unspecified atrioventricular block: Secondary | ICD-10-CM | POA: Diagnosis present

## 2018-04-06 DIAGNOSIS — Z23 Encounter for immunization: Secondary | ICD-10-CM

## 2018-04-06 DIAGNOSIS — M19011 Primary osteoarthritis, right shoulder: Secondary | ICD-10-CM | POA: Diagnosis present

## 2018-04-06 DIAGNOSIS — Z853 Personal history of malignant neoplasm of breast: Secondary | ICD-10-CM | POA: Diagnosis not present

## 2018-04-06 DIAGNOSIS — I5032 Chronic diastolic (congestive) heart failure: Secondary | ICD-10-CM | POA: Diagnosis present

## 2018-04-06 DIAGNOSIS — I82411 Acute embolism and thrombosis of right femoral vein: Secondary | ICD-10-CM | POA: Diagnosis present

## 2018-04-06 DIAGNOSIS — Z79899 Other long term (current) drug therapy: Secondary | ICD-10-CM

## 2018-04-06 DIAGNOSIS — M19012 Primary osteoarthritis, left shoulder: Secondary | ICD-10-CM | POA: Diagnosis present

## 2018-04-06 DIAGNOSIS — Z9011 Acquired absence of right breast and nipple: Secondary | ICD-10-CM

## 2018-04-06 DIAGNOSIS — I2699 Other pulmonary embolism without acute cor pulmonale: Secondary | ICD-10-CM | POA: Diagnosis present

## 2018-04-06 DIAGNOSIS — Z791 Long term (current) use of non-steroidal anti-inflammatories (NSAID): Secondary | ICD-10-CM | POA: Diagnosis not present

## 2018-04-06 DIAGNOSIS — Z8249 Family history of ischemic heart disease and other diseases of the circulatory system: Secondary | ICD-10-CM

## 2018-04-06 DIAGNOSIS — I110 Hypertensive heart disease with heart failure: Secondary | ICD-10-CM | POA: Diagnosis present

## 2018-04-06 DIAGNOSIS — E785 Hyperlipidemia, unspecified: Secondary | ICD-10-CM | POA: Diagnosis present

## 2018-04-06 DIAGNOSIS — Z90710 Acquired absence of both cervix and uterus: Secondary | ICD-10-CM

## 2018-04-06 DIAGNOSIS — Z7983 Long term (current) use of bisphosphonates: Secondary | ICD-10-CM

## 2018-04-06 DIAGNOSIS — R0602 Shortness of breath: Secondary | ICD-10-CM

## 2018-04-06 DIAGNOSIS — J9621 Acute and chronic respiratory failure with hypoxia: Secondary | ICD-10-CM | POA: Diagnosis present

## 2018-04-06 LAB — BASIC METABOLIC PANEL
Anion gap: 8 (ref 5–15)
BUN: 13 mg/dL (ref 6–20)
CHLORIDE: 101 mmol/L (ref 101–111)
CO2: 27 mmol/L (ref 22–32)
Calcium: 8.6 mg/dL — ABNORMAL LOW (ref 8.9–10.3)
Creatinine, Ser: 0.98 mg/dL (ref 0.44–1.00)
GFR calc Af Amer: 58 mL/min — ABNORMAL LOW (ref >60)
GFR calc non Af Amer: 50 mL/min — ABNORMAL LOW (ref >60)
Glucose, Bld: 138 mg/dL — ABNORMAL HIGH (ref 65–99)
POTASSIUM: 4.1 mmol/L (ref 3.5–5.1)
SODIUM: 136 mmol/L (ref 135–145)

## 2018-04-06 LAB — CBC WITH DIFFERENTIAL/PLATELET
BASOS ABS: 0.1 10*3/uL (ref 0–0.1)
Basophils Relative: 1 %
Eosinophils Absolute: 0.2 10*3/uL (ref 0–0.7)
Eosinophils Relative: 1 %
HEMATOCRIT: 41.3 % (ref 35.0–47.0)
Hemoglobin: 14 g/dL (ref 12.0–16.0)
LYMPHS ABS: 1.3 10*3/uL (ref 1.0–3.6)
LYMPHS PCT: 7 %
MCH: 31.5 pg (ref 26.0–34.0)
MCHC: 34 g/dL (ref 32.0–36.0)
MCV: 92.6 fL (ref 80.0–100.0)
MONO ABS: 1.1 10*3/uL — AB (ref 0.2–0.9)
MONOS PCT: 6 %
Neutro Abs: 15.4 10*3/uL — ABNORMAL HIGH (ref 1.4–6.5)
Neutrophils Relative %: 85 %
Platelets: 292 10*3/uL (ref 150–440)
RBC: 4.45 MIL/uL (ref 3.80–5.20)
RDW: 13.5 % (ref 11.5–14.5)
WBC: 18.2 10*3/uL — ABNORMAL HIGH (ref 3.6–11.0)

## 2018-04-06 LAB — HEPATIC FUNCTION PANEL
ALT: 19 U/L (ref 14–54)
AST: 26 U/L (ref 15–41)
Albumin: 3.1 g/dL — ABNORMAL LOW (ref 3.5–5.0)
Alkaline Phosphatase: 56 U/L (ref 38–126)
Bilirubin, Direct: 0.2 mg/dL (ref 0.1–0.5)
Indirect Bilirubin: 0.7 mg/dL (ref 0.3–0.9)
TOTAL PROTEIN: 6.8 g/dL (ref 6.5–8.1)
Total Bilirubin: 0.9 mg/dL (ref 0.3–1.2)

## 2018-04-06 LAB — APTT: aPTT: 28 seconds (ref 24–36)

## 2018-04-06 LAB — MRSA PCR SCREENING: MRSA BY PCR: NEGATIVE

## 2018-04-06 LAB — HEPARIN LEVEL (UNFRACTIONATED): Heparin Unfractionated: 0.78 IU/mL — ABNORMAL HIGH (ref 0.30–0.70)

## 2018-04-06 LAB — LIPASE, BLOOD: LIPASE: 36 U/L (ref 11–51)

## 2018-04-06 LAB — PROTIME-INR
INR: 1.05
PROTHROMBIN TIME: 13.6 s (ref 11.4–15.2)

## 2018-04-06 LAB — TROPONIN I: Troponin I: <0.03 ng/mL (ref <0.03)

## 2018-04-06 LAB — BRAIN NATRIURETIC PEPTIDE: B Natriuretic Peptide: 120 pg/mL — ABNORMAL HIGH (ref 0.0–100.0)

## 2018-04-06 MED ORDER — PNEUMOCOCCAL VAC POLYVALENT 25 MCG/0.5ML IJ INJ
0.5000 mL | INJECTION | INTRAMUSCULAR | Status: AC
  Administered 2018-04-07: 0.5 mL via INTRAMUSCULAR
  Filled 2018-04-06: qty 0.5

## 2018-04-06 MED ORDER — ALBUTEROL SULFATE (2.5 MG/3ML) 0.083% IN NEBU: 2.5000 mg | INHALATION_SOLUTION | RESPIRATORY_TRACT | Status: DC | PRN

## 2018-04-06 MED ORDER — ACETAMINOPHEN 325 MG PO TABS: 650.0000 mg | ORAL_TABLET | Freq: Four times a day (QID) | ORAL | Status: DC | PRN

## 2018-04-06 MED ORDER — ONDANSETRON HCL 4 MG/2ML IJ SOLN: 4.0000 mg | Freq: Four times a day (QID) | INTRAMUSCULAR | Status: DC | PRN

## 2018-04-06 MED ORDER — HEPARIN BOLUS VIA INFUSION
3500.0000 [IU] | Freq: Once | INTRAVENOUS | Status: AC
  Administered 2018-04-06: 3500 [IU] via INTRAVENOUS
  Filled 2018-04-06: qty 3500

## 2018-04-06 MED ORDER — ONDANSETRON HCL 4 MG PO TABS: 4.0000 mg | ORAL_TABLET | Freq: Four times a day (QID) | ORAL | Status: DC | PRN

## 2018-04-06 MED ORDER — SODIUM CHLORIDE 0.9 % IV SOLN: 250.0000 mL | INTRAVENOUS | Status: DC | PRN

## 2018-04-06 MED ORDER — SENNOSIDES-DOCUSATE SODIUM 8.6-50 MG PO TABS: 1.0000 | ORAL_TABLET | Freq: Every evening | ORAL | Status: DC | PRN

## 2018-04-06 MED ORDER — SODIUM CHLORIDE 0.9% FLUSH
3.0000 mL | Freq: Two times a day (BID) | INTRAVENOUS | Status: DC
  Administered 2018-04-06 (×2): 3 mL via INTRAVENOUS

## 2018-04-06 MED ORDER — DONEPEZIL HCL 5 MG PO TABS
10.0000 mg | ORAL_TABLET | Freq: Every evening | ORAL | Status: DC
  Administered 2018-04-06: 10 mg via ORAL
  Filled 2018-04-06 (×2): qty 2

## 2018-04-06 MED ORDER — HYDROCODONE-ACETAMINOPHEN 5-325 MG PO TABS
1.0000 | ORAL_TABLET | ORAL | Status: DC | PRN
  Administered 2018-04-07: 1 via ORAL
  Filled 2018-04-06: qty 1

## 2018-04-06 MED ORDER — SODIUM CHLORIDE 0.9% FLUSH: 3.0000 mL | INTRAVENOUS | Status: DC | PRN

## 2018-04-06 MED ORDER — ACETAMINOPHEN 650 MG RE SUPP: 650.0000 mg | Freq: Four times a day (QID) | RECTAL | Status: DC | PRN

## 2018-04-06 MED ORDER — BISACODYL 5 MG PO TBEC: 5.0000 mg | DELAYED_RELEASE_TABLET | Freq: Every day | ORAL | Status: DC | PRN

## 2018-04-06 MED ORDER — HEPARIN (PORCINE) IN NACL 100-0.45 UNIT/ML-% IJ SOLN
850.0000 [IU]/h | INTRAMUSCULAR | Status: DC
  Administered 2018-04-06: 950 [IU]/h via INTRAVENOUS
  Filled 2018-04-06 (×2): qty 250

## 2018-04-06 MED ORDER — IOPAMIDOL (ISOVUE-370) INJECTION 76%
75.0000 mL | Freq: Once | INTRAVENOUS | Status: AC | PRN
  Administered 2018-04-06: 75 mL via INTRAVENOUS

## 2018-04-06 MED ORDER — MEMANTINE HCL 5 MG PO TABS
10.0000 mg | ORAL_TABLET | Freq: Two times a day (BID) | ORAL | Status: DC
  Administered 2018-04-06 – 2018-04-07 (×3): 10 mg via ORAL
  Filled 2018-04-06 (×3): qty 2

## 2018-04-06 MED ORDER — PRAVASTATIN SODIUM 20 MG PO TABS
40.0000 mg | ORAL_TABLET | Freq: Every evening | ORAL | Status: DC
  Administered 2018-04-06: 40 mg via ORAL
  Filled 2018-04-06: qty 2

## 2018-04-06 NOTE — Progress Notes (Signed)
ANTICOAGULATION CONSULT NOTE  Pharmacy Consult for heparin Indication: pulmonary embolus  No Known Allergies  Patient Measurements: Height: 4\' 11"  (149.9 cm) Weight: 145 lb (65.8 kg) IBW/kg (Calculated) : 43.2 Heparin Dosing Weight: 57.5 kg  Vital Signs: Temp: 99 F (37.2 C) (05/08 1359) Temp Source: Oral (05/08 1359) BP: 96/71 (05/08 1359) Pulse Rate: 70 (05/08 1359)  Labs: Recent Labs    04/06/18 0946 04/06/18 1919  HGB 14.0  --   HCT 41.3  --   PLT 292  --   APTT 28  --   LABPROT 13.6  --   INR 1.05  --   HEPARINUNFRC  --  0.78*  CREATININE 0.98  --   TROPONINI <0.03  --     Estimated Creatinine Clearance: 32.1 mL/min (by C-G formula based on SCr of 0.98 mg/dL).   Medical History: Past Medical History:  Diagnosis Date  . Arrhythmia   . Breast cancer [facility])    s/p mastectomy  . Diastolic dysfunction    echo 11/07. EF 60-65%. mild to moderate PI  . GERD (gastroesophageal reflux disease)   . HTN (hypertension)   . Hyperlipidemia   . Memory loss   . Osteoarthritis (arthritis due to wear and tear of joints)   . Palpitations    monitor with PACs/PVCs. neg myoview 2007  . Syncope and collapse     Assessment: 84yo female on no anticoagulants PTA and >25% over IBW  Goal of Therapy:  Heparin level 0.3-0.7 units/ml Monitor platelets by anticoagulation protocol: Yes   Plan:  Bolus dose calculated at 3500 units, then 950 units per hour. Heparin level ordered for 1930 (drip began at 1136am)  5/8 1919 Heparin level= 0.78. Will decrease rate to 900 units/hr. Will recheck HL in 8 hrs.  Demontez Novack A 04/06/2018,8:52 PM

## 2018-04-06 NOTE — ED Provider Notes (Addendum)
[facility] Emergency Department Provider Note  ____________________________________________   I have reviewed the triage vital signs and the nursing notes. Where available I have reviewed prior notes and, if possible and indicated, outside hospital notes.    HISTORY  Chief Complaint Chest Pain and Shortness of Breath    HPI Erin Shannon is a 84 y.o. female 3 of cardiac arrhythmia, history of EF of 55 to 60%, on last Doppler in November 2018 presents today with shortness of breath and pleuritic chest discomfort.  Patient states it has been there since this morning.  She does suffer from dementia most of the history as per EMS.  EMS she is on 2 L of nasal cannula but they did found her with low sats there.  They put on a nonrebreather and she came up immediately.  Patient has no complaints at this time unless reminded.  She denies any fever or chills, but again very limited history.  Level 5 chart caveat; no further history available due to patient status.  Receive nitroglycerin and aspirin en route  Past Medical History:  Diagnosis Date  . Arrhythmia   . Breast cancer [facility])    s/p mastectomy  . Diastolic dysfunction    echo 11/07. EF 60-65%. mild to moderate PI  . GERD (gastroesophageal reflux disease)   . HTN (hypertension)   . Hyperlipidemia   . Memory loss   . Osteoarthritis (arthritis due to wear and tear of joints)   . Palpitations    monitor with PACs/PVCs. neg myoview 2007  . Syncope and collapse     Patient Active Problem List   Diagnosis Date Noted  . Acute lower UTI 03/26/2018  . UTI (urinary tract infection) 12/24/2016  . Mobitz (type) I (Wenckebach's) atrioventricular block 12/24/2016  . Demand ischemia ([place]) 12/24/2016  . Weakness 12/24/2016  . ARF (acute renal failure) ([place]) 12/23/2016  . Chronic venous insufficiency 07/27/2014  . BRADYCARDIA 12/19/2010  . DIASTOLIC HEART FAILURE, CHRONIC 12/19/2010  . HYPERTENSION, BENIGN  02/06/2009  . PALPITATIONS 02/06/2009    Past Surgical History:  Procedure Laterality Date  . hysterectomy-unspecified area    . masectomy     R    Prior to Admission medications   Medication Sig Start Date End Date Taking? Authorizing Provider  alendronate (FOSAMAX) 70 MG tablet Take 70 mg by mouth every Monday. Take with a full glass of water on an empty stomach.     [provider]  Calcium Citrate-Vitamin D (CALCIUM CITRATE + D PO) Take 1 tablet by mouth every evening. 630mg /500     [provider]  celecoxib (CELEBREX) 200 MG capsule Take 1 capsule (200 mg total) by mouth daily. 03/28/18 03/28/19  Bettey Costa, MD  donepezil (ARICEPT) 10 MG tablet Take 10 mg by mouth every evening.     [provider]  ibuprofen (ADVIL,MOTRIN) 400 MG tablet Take 1 tablet (400 mg total) by mouth every 6 (six) hours as needed for fever, headache, moderate pain or cramping. 03/27/18   Bettey Costa, MD  memantine (NAMENDA) 10 MG tablet Take 10 mg by mouth 2 (two) times daily.      [provider]  Multiple Vitamins-Minerals (COMPLETE WOMENS) TABS Take 1 tablet by mouth daily.      [provider]  pravastatin (PRAVACHOL) 40 MG tablet Take 40 mg by mouth every evening.     [provider]    Allergies Patient has no known allergies.  Family History  Problem Relation  Age of Onset  . Heart failure Father   . Heart disease Brother     Social History Social History   Tobacco Use  . Smoking status: Never Smoker  . Smokeless tobacco: Never Used  . Tobacco comment: tobacco use -no  Substance Use Topics  . Alcohol use: No  . Drug use: No    Review of Systems Constitutional: No fever/chills Eyes: No visual changes. ENT: No sore throat. No stiff neck no neck pain Cardiovascular: Has had chest pain apparently Respiratory: Denies shortness of breath. Gastrointestinal:   no vomiting.  No diarrhea.  No constipation. Genitourinary: Negative for  dysuria. Musculoskeletal: Negative lower extremity swelling Skin: Negative for rash. Neurological: Negative for severe headaches, focal weakness or numbness.   ____________________________________________   PHYSICAL EXAM:  VITAL SIGNS: ED Triage Vitals [04/06/18 0934]  Enc Vitals Group     BP      Pulse      Resp      Temp      Temp src      SpO2 (!) 85 %     Weight      Height      Head Circumference      Peak Flow      Pain Score      Pain Loc      Pain Edu?      Excl. in [place]?     Constitutional: Alert and oriented. Well appearing and in no acute distress. Eyes: Conjunctivae are normal Head: Atraumatic HEENT: No congestion/rhinnorhea. Mucous membranes are moist.  Oropharynx non-erythematous Neck:   Nontender with no meningismus, no masses, no stridor Cardiovascular: Normal rate, regular rhythm. Grossly normal heart sounds.  Good peripheral circulation. Respiratory: What increased respiratory rate, diminished in the bases poor respiratory effort for me, limited exam Abdominal: Soft and nontender. No distention. No guarding no rebound Back:  There is no focal tenderness or step off.  there is no midline tenderness there are no lesions noted. there is no CVA tenderness Musculoskeletal: No lower extremity tenderness, no upper extremity tenderness. No joint effusions, no DVT signs strong distal pulses no edema patient was wearing an knee immobilizer on the right side, Neurologic:  Normal speech and language. No gross focal neurologic deficits are appreciated.  Skin:  Skin is warm, dry and intact. No rash noted. Psychiatric: Mood and affect are normal. Speech and behavior are normal.  ____________________________________________   LABS (all labs ordered are listed, but only abnormal results are displayed)  Labs Reviewed  CBC WITH DIFFERENTIAL/PLATELET  BRAIN NATRIURETIC PEPTIDE  TROPONIN I  BASIC METABOLIC PANEL  LIPASE, BLOOD  HEPATIC FUNCTION PANEL    Pertinent  labs  results that were available during my care of the patient were reviewed by me and considered in my medical decision making (see chart for details). ____________________________________________  EKG  I personally interpreted any EKGs ordered by me or triage Sinus tach rate 105, LAD noted.  No acute ischemic changes nonspecific ST changes. ____________________________________________  RADIOLOGY  Pertinent labs & imaging results that were available during my care of the patient were reviewed by me and considered in my medical decision making (see chart for details). If possible, patient and/or family made aware of any abnormal findings.  No results found. ____________________________________________    PROCEDURES  Procedure(s) performed: None  Procedures  Critical Care performed: CRITICAL CARE Performed by: Schuyler Amor   Total critical care time: 45 minutes  Critical care time was exclusive of separately billable  procedures and treating other patients.  Critical care was necessary to treat or prevent imminent or life-threatening deterioration.  Critical care was time spent personally by me on the following activities: development of treatment plan with patient and/or surrogate as well as nursing, discussions with consultants, evaluation of patient's response to treatment, examination of patient, obtaining history from patient or surrogate, ordering and performing treatments and interventions, ordering and review of laboratory studies, ordering and review of radiographic studies, pulse oximetry and re-evaluation of patient's condition.   ____________________________________________   INITIAL IMPRESSION / ASSESSMENT AND PLAN / ED COURSE  Pertinent labs & imaging results that were available during my care of the patient were reviewed by me and considered in my medical decision making (see chart for details).  Patient here with shortness of breath, hypoxia below  baseline, and poorly described chest pain which she is not having at this time that she takes a deep breath.  We will further evaluate.  If her creatinine can take it we will do a CT chest to rule out PE.  We will also evaluate for possible cardiac etiologies including CHF ACS  ----------------------------------------- 11:08 AM on 04/06/2018 -----------------------------------------  Patient does have suspected unfortunately had a large PE, no right heart strain seen on imaging, we will start heparin per pharmacy protocol which has been ordered, coags have been ordered, will admit to the hospitalist service.   ____________________________________________   FINAL CLINICAL IMPRESSION(S) / ED DIAGNOSES  Final diagnoses:  SOB (shortness of breath)      This chart was dictated using voice recognition software.  Despite best efforts to proofread,  errors can occur which can change meaning.      Schuyler Amor, MD 04/06/18 7544    Schuyler Amor, MD 04/06/18 [phone]

## 2018-04-06 NOTE — ED Triage Notes (Signed)
Patient to ED from [facility] via [facility] c/o SOB and chest pain. Per EMS patient began having chest pain and feeling short of breath. Staff at [facility] administered 2 nitros without relief. At the scene EMS reports patient sating at 85% and placed patient on non rebreather and administered 324 mg of ASA.

## 2018-04-06 NOTE — H&P (Addendum)
[place] at [place] NAME: Erin Shannon    MR#:  [id number]  DATE OF BIRTH:  1927-01-10  DATE OF ADMISSION:  04/06/2018  PRIMARY CARE PHYSICIAN: [name], MD   REQUESTING/REFERRING PHYSICIAN: Dr. Burlene Arnt  CHIEF COMPLAINT:   Chief Complaint  Patient presents with  . Chest Pain  . Shortness of Breath   Chest pain the shortness of breath. HISTORY OF PRESENT ILLNESS:  Erin Shannon  is a 84 y.o. female with a known history of hypertension, hyperlipidemia, arrhythmia, dementia, osteoarthritis and breast cancer.  The patient was recently admitted for UTI.  She was just discharged on April 29.  She is sent to ED from assisted living due to above chief complaints.  The patient has dementia and is very poor historian.  She only complains of chest pain on the right side but denies any other symptoms.  She is found hypoxia below baseline and put on oxygen 2 L by nasal cannula.  ED physician ordered CT angiogram of the chest which show large PE but no right heart strain.  Heparin drip was started.  PAST MEDICAL HISTORY:   Past Medical History:  Diagnosis Date  . Arrhythmia   . Breast cancer [facility])    s/p mastectomy  . Diastolic dysfunction    echo 11/07. EF 60-65%. mild to moderate PI  . GERD (gastroesophageal reflux disease)   . HTN (hypertension)   . Hyperlipidemia   . Memory loss   . Osteoarthritis (arthritis due to wear and tear of joints)   . Palpitations    monitor with PACs/PVCs. neg myoview 2007  . Syncope and collapse     PAST SURGICAL HISTORY:   Past Surgical History:  Procedure Laterality Date  . hysterectomy-unspecified area    . masectomy     R    SOCIAL HISTORY:   Social History   Tobacco Use  . Smoking status: Never Smoker  . Smokeless tobacco: Never Used  . Tobacco comment: tobacco use -no  Substance Use Topics  . Alcohol use: No    FAMILY HISTORY:   Family History  Problem Relation Age of Onset  .  Heart failure Father   . Heart disease Brother     DRUG ALLERGIES:  No Known Allergies  REVIEW OF SYSTEMS:   Review of Systems  Unable to perform ROS: Dementia    MEDICATIONS AT HOME:   Prior to Admission medications   Medication Sig Start Date End Date Taking? Authorizing Provider  alendronate (FOSAMAX) 70 MG tablet Take 70 mg by mouth every Monday. Take with a full glass of water on an empty stomach.    Yes [provider]  Calcium Citrate-Vitamin D (CALCIUM CITRATE + D PO) Take 1 tablet by mouth every evening. 630mg /500    Yes [provider]  donepezil (ARICEPT) 10 MG tablet Take 10 mg by mouth every evening.    Yes [provider]  ibuprofen (ADVIL,MOTRIN) 400 MG tablet Take 1 tablet (400 mg total) by mouth every 6 (six) hours as needed for fever, headache, moderate pain or cramping. 03/27/18  Yes Mody, Sital, MD  memantine (NAMENDA) 10 MG tablet Take 10 mg by mouth 2 (two) times daily.     Yes [provider]  Multiple Vitamins-Minerals (COMPLETE WOMENS) TABS Take 1 tablet by mouth daily.     Yes [provider]  pravastatin (PRAVACHOL) 40 MG tablet Take 40 mg by mouth every evening.    Yes  [provider]  celecoxib (CELEBREX) 200 MG capsule Take 1 capsule (200 mg total) by mouth daily. Patient not taking: Reported on 04/06/2018 03/28/18 03/28/19  Bettey Costa, MD      VITAL SIGNS:  Blood pressure (!) 117/59, pulse 65, temperature 98.1 F (36.7 C), temperature source Oral, resp. rate 16, height 4\' 11"  (1.499 m), weight 145 lb (65.8 kg), SpO2 96 %.  PHYSICAL EXAMINATION:  Physical Exam  GENERAL:  84 y.o.-year-old patient lying in the bed with no acute distress.  EYES: Pupils equal, round, reactive to light and accommodation. No scleral icterus. Extraocular muscles intact.  HEENT: Head atraumatic, normocephalic. Oropharynx and nasopharynx clear.  NECK:  Supple, no jugular venous distention. No thyroid enlargement, no  tenderness.  LUNGS: Normal breath sounds bilaterally, no wheezing, rales,rhonchi or crepitation. No use of accessory muscles of respiration.  CARDIOVASCULAR: S1, S2 normal. No murmurs, rubs, or gallops.  ABDOMEN: Soft, nontender, nondistended. Bowel sounds present. No organomegaly or mass.  EXTREMITIES: No pedal edema, cyanosis, or clubbing.  NEUROLOGIC: Cranial nerves II through XII are intact. Muscle strength 4/5 in all extremities. Sensation intact. Gait not checked.  PSYCHIATRIC: The patient is alert and oriented x 2-3.  SKIN: No obvious rash, lesion, or ulcer.   LABORATORY PANEL:   CBC Recent Labs  Lab 04/06/18 0946  WBC 18.2*  HGB 14.0  HCT 41.3  PLT 292   ------------------------------------------------------------------------------------------------------------------  Chemistries  Recent Labs  Lab 04/06/18 0946  NA 136  K 4.1  CL 101  CO2 27  GLUCOSE 138*  BUN 13  CREATININE 0.98  CALCIUM 8.6*  AST 26  ALT 19  ALKPHOS 56  BILITOT 0.9   ------------------------------------------------------------------------------------------------------------------  Cardiac Enzymes Recent Labs  Lab 04/06/18 0946  TROPONINI <0.03   ------------------------------------------------------------------------------------------------------------------  RADIOLOGY:  Ct Angio Chest Pe W And/or Wo Contrast  Result Date: 04/06/2018 CLINICAL DATA:  cardiac arrhythmia, history of EF of 55 to 60%, on last Doppler in November 2018 presents today with shortness of breath and pleuritic chest discomfort. Patient states it has been there since this morning. She does suffer from dementia most of the history as per EMS. EMS she is on 2 L of nasal cannula but they did found her with low sats there. They put on a nonrebreather and she came up immediately. EXAM: CT ANGIOGRAPHY CHEST WITH CONTRAST TECHNIQUE: Multidetector CT imaging of the chest was performed using the standard protocol during bolus  administration of intravenous contrast. Multiplanar CT image reconstructions and MIPs were obtained to evaluate the vascular anatomy. CONTRAST:  72mL ISOVUE-370 IOPAMIDOL (ISOVUE-370) INJECTION 76% COMPARISON:  05/14/1998 by report only FINDINGS: Cardiovascular: Heart size upper limits normal. No pericardial effusion. RV/LV ratio 0.75. Borderline dilatation of central pulmonary arteries. Large central and partially occlusive segmental pulmonary emboli extending into both upper and lower lobes, more extensive right than left. Adequate contrast opacification of the thoracic aorta with no evidence of dissection, aneurysm, or stenosis. There is classic 3-vessel brachiocephalic arch anatomy without proximal stenosis. Minimal scattered atheromatous calcified plaque. Mediastinum/Nodes: No enlarged mediastinal, hilar, or axillary lymph nodes. Thyroid gland, trachea demonstrate no significant findings. Small hiatal hernia. Lungs/Pleura: Trace right pleural effusion. Consolidation/atelectasis posteriorly in the lower lobes left greater than right. Peripheral subsegmental wedge-shaped opacities in the right middle lobe. Marked asymmetric enhancement of the atelectatic portions left lower lobe. No pneumothorax. Upper Abdomen: No acute findings Musculoskeletal: Spondylitic changes at multiple levels in the lower cervical, mid and lower thoracic spine. No fracture or worrisome bone lesion. Advanced  DJD in bilateral shoulders. Review of the MIP images confirms the above findings. IMPRESSION: 1. Bilateral central and segmental pulmonary emboli, right worse than left, without CT evidence of right heart strain. Critical Value/emergent results were discussed by telephone at the time of interpretation on 04/06/2018 at 11:11 am with Dr. Charlotte Crumb , who verbally acknowledged these results. 2. Trace right pleural effusion. 3.  Aortic Atherosclerosis (ICD10-170.0) 4. Small hiatal hernia Electronically Signed   By: Lucrezia Europe M.D.   On:  04/06/2018 11:13   Dg Chest Port 1 View  Result Date: 04/06/2018 CLINICAL DATA:  84 year old female with cardiac arrhythmia. Shortness of breath. Pleuritic chest discomfort. Initial encounter. EXAM: PORTABLE CHEST 1 VIEW COMPARISON:  03/27/2018. FINDINGS: Left-sided pleural effusion.  Left base atelectasis or infiltrate. Minimal pleural based opacity peripheral aspect right lower lobe may represent minimal atelectasis (No well-defined consolidation as seen with [place] hump). Central pulmonary vascular prominence without pulmonary edema. Cardiomegaly. Calcified tortuous aorta. Bilateral shoulder joint degenerative changes. IMPRESSION: Left-sided pleural effusion. Left base atelectasis or infiltrate. Cardiomegaly. Aortic Atherosclerosis (ICD10-I70.0). Electronically Signed   By: Genia Del M.D.   On: 04/06/2018 10:26      IMPRESSION AND PLAN:   Bilateral PE. The patient will be admitted to medical floor. Continue heparin drip, may change to oral anticoagulation upon discharge. Check venous duplex of bilateral lower extremity.  Acute on chronic respiratory failure with hypoxia due to above.  Continue oxygen by nasal cannula NEB PRN. Leukocytosis.  For possible due to reaction.  Follow-up CBC. Hypertension.  Blood pressure is controlled with salt hypertension medication.  All the records are reviewed and case discussed with ED provider. Management plans discussed with the patient's daughter POA and they are in agreement.  CODE STATUS: Full code.  TOTAL TIME TAKING CARE OF THIS PATIENT: 47 minutes.    Demetrios Loll M.D on 04/06/2018 at 12:29 PM  Between 7am to 6pm - Pager - [phone]  After 6pm go to [email or web address] - Proofreader  [facility]  Office  [phone]  CC: Primary care physician; [name], MD   Note: This dictation was prepared with Dragon dictation along with smaller phrase technology. Any transcriptional errors that result from  this process are unin

## 2018-04-06 NOTE — ED Notes (Addendum)
Pt daughter arrives, states that patient needs pink sleeve on right arm as she had mastectomy to right side in 2001. No pink sleeve came with patient, this RN unaware. Provided pink sleeve. BP cuff moved to left arm only.

## 2018-04-06 NOTE — ED Notes (Signed)
Attempt to call report X 1 unsuccessful.  

## 2018-04-06 NOTE — ED Notes (Addendum)
Called lab to verify that they have appropriate blood to run ordered tests. Confirmed enough for blue top APTT and Protime-INR. Called prior to starting heparin.

## 2018-04-06 NOTE — Progress Notes (Signed)
Advanced Care Plan.  Purpose of Encounter: CODE STATUS. Parties in Attendance: The patient, her daughter POA and me. Patient's Decisional Capacity: No. Medical Story: Erin Shannon  is a 84 y.o. female with a known history of hypertension, hyperlipidemia, arrhythmia, dementia, osteoarthritis and breast cancer.  The patient was recently admitted for UTI.  She is being admitted for bilateral PE and acute on chronic respiratory failure.  I discussed with the patient's daughter about her current condition, poor prognosis and CODE STATUS.  The patient was seen DNR status in recent hospitalization.  But her daughter want to change to full code at this time.  Plan:  Code Status: Full code. Time spent discussing advance care planning: 17 minutes.

## 2018-04-06 NOTE — Progress Notes (Signed)
[place] for heparin Indication: pulmonary embolus  No Known Allergies  Patient Measurements: Height: 4\' 11"  (149.9 cm) Weight: 145 lb (65.8 kg) IBW/kg (Calculated) : 43.2 Heparin Dosing Weight: 57.5 kg  Vital Signs: Temp: 98.1 F (36.7 C) (05/08 0942) Temp Source: Oral (05/08 0942) BP: 126/50 (05/08 1115) Pulse Rate: 72 (05/08 1115)  Labs: Recent Labs    04/06/18 0946  HGB 14.0  HCT 41.3  PLT 292  CREATININE 0.98  TROPONINI <0.03    Estimated Creatinine Clearance: 32.1 mL/min (by C-G formula based on SCr of 0.98 mg/dL).   Medical History: Past Medical History:  Diagnosis Date  . Arrhythmia   . Breast cancer [facility])    s/p mastectomy  . Diastolic dysfunction    echo 11/07. EF 60-65%. mild to moderate PI  . GERD (gastroesophageal reflux disease)   . HTN (hypertension)   . Hyperlipidemia   . Memory loss   . Osteoarthritis (arthritis due to wear and tear of joints)   . Palpitations    monitor with PACs/PVCs. neg myoview 2007  . Syncope and collapse     Assessment: 84yo female on no anticoagulants PTA and >25% over IBW  Goal of Therapy:  Heparin level 0.3-0.7 units/ml Monitor platelets by anticoagulation protocol: Yes   Plan:  Bolus dose calculated at 3500 units, then 950 units per hour. Heparin level ordered for 1930 (drip began at 1136am)  Dallie Piles 04/06/2018,11:20 AM

## 2018-04-06 NOTE — ED Notes (Addendum)
Spoke with EDP, Dr. Burlene Arnt regarding patient PIV to right arm. States that it may stay and label with "EMERGENCY USE ONLY".  Patient R PIV was started prior to heparin administration. Saline lock only.

## 2018-04-06 NOTE — ED Notes (Signed)
Patient transported to CT 

## 2018-04-06 NOTE — ED Notes (Signed)
Report to [place], [place]

## 2018-04-06 NOTE — ED Notes (Signed)
Taken to floor by this Therapist, sports. Daughter accompanied. All of belongings sent with patient.

## 2018-04-06 NOTE — ED Notes (Signed)
Right sided chest pain with deep breaths that began today. Pt confused, hx of dementia. Alert to self and place, disoriented to situation and time. Pt right lower leg wrapped with ace wrap and brace on arrival-pt unsure why. Swelling to right lower extremity. Pt denies CP at this time. Pt given ice chips per request.

## 2018-04-07 LAB — BASIC METABOLIC PANEL
ANION GAP: 8 (ref 5–15)
BUN: 13 mg/dL (ref 6–20)
CHLORIDE: 104 mmol/L (ref 101–111)
CO2: 26 mmol/L (ref 22–32)
Calcium: 8.2 mg/dL — ABNORMAL LOW (ref 8.9–10.3)
Creatinine, Ser: 0.88 mg/dL (ref 0.44–1.00)
GFR calc Af Amer: >60 mL/min (ref >60)
GFR, EST NON AFRICAN AMERICAN: 57 mL/min — AB (ref >60)
GLUCOSE: 96 mg/dL (ref 65–99)
Potassium: 4 mmol/L (ref 3.5–5.1)
SODIUM: 138 mmol/L (ref 135–145)

## 2018-04-07 LAB — CBC
HEMATOCRIT: 38.1 % (ref 35.0–47.0)
HEMOGLOBIN: 12.9 g/dL (ref 12.0–16.0)
MCH: 31 pg (ref 26.0–34.0)
MCHC: 33.8 g/dL (ref 32.0–36.0)
MCV: 91.8 fL (ref 80.0–100.0)
Platelets: 278 10*3/uL (ref 150–440)
RBC: 4.15 MIL/uL (ref 3.80–5.20)
RDW: 13.5 % (ref 11.5–14.5)
WBC: 14.6 10*3/uL — AB (ref 3.6–11.0)

## 2018-04-07 LAB — HEPARIN LEVEL (UNFRACTIONATED): Heparin Unfractionated: 0.71 IU/mL — ABNORMAL HIGH (ref 0.30–0.70)

## 2018-04-07 MED ORDER — APIXABAN 5 MG PO TABS
10.0000 mg | ORAL_TABLET | Freq: Two times a day (BID) | ORAL | Status: DC
  Administered 2018-04-07: 10 mg via ORAL
  Filled 2018-04-07: qty 2

## 2018-04-07 MED ORDER — ACETAMINOPHEN 325 MG PO TABS: 650.0000 mg | ORAL_TABLET | Freq: Four times a day (QID) | ORAL | Status: DC | PRN

## 2018-04-07 MED ORDER — APIXABAN 5 MG PO TABS: 10.0000 mg | ORAL_TABLET | Freq: Two times a day (BID) | ORAL | Status: DC

## 2018-04-07 MED ORDER — TRAMADOL HCL 50 MG PO TABS: 50.0000 mg | ORAL_TABLET | Freq: Four times a day (QID) | ORAL | 0 refills | Status: DC | PRN

## 2018-04-07 MED ORDER — APIXABAN 5 MG PO TABS
5.0000 mg | ORAL_TABLET | Freq: Two times a day (BID) | ORAL | Status: DC
Start: 2018-04-14 — End: 2018-04-07

## 2018-04-07 MED ORDER — APIXABAN 5 MG PO TABS: 10.0000 mg | ORAL_TABLET | Freq: Two times a day (BID) | ORAL | 0 refills | Status: DC

## 2018-04-07 MED ORDER — SENNOSIDES-DOCUSATE SODIUM 8.6-50 MG PO TABS: 1.0000 | ORAL_TABLET | Freq: Every evening | ORAL | Status: AC | PRN

## 2018-04-07 NOTE — Clinical Social Work Note (Signed)
Clinical Social Work Assessment  Patient Details  Name: Erin Shannon MRN: [id number] Date of Birth: 1927/11/16  Date of referral:  04/07/18               Reason for consult:  Facility Placement                Permission sought to share information with:  Facility Sport and exercise psychologist, Tourist information centre manager, Family Supports Permission granted to share information::  Yes, Verbal Permission Granted  Name::        Agency::     Relationship::     Contact Information:     Housing/Transportation Living arrangements for the past 2 months:  [place], [place] of Information:  Adult Children Patient Interpreter Needed:  None Criminal Activity/Legal Involvement Pertinent to Current Situation/Hospitalization:  No - Comment as needed Significant Relationships:  Adult Children Lives with:  Self, Facility Resident Do you feel safe going back to the place where you live?  Yes Need for family participation in patient care:  Yes (Comment)  Care giving concerns:  Patient recently discharged from [facility] to [facility] for SNF rehab. Patient has cognitive deficits and is alert and oriented to self only.    Social Worker assessment / plan:  CSW made aware that patient admitted from [facility]. CSW met with patient and her daughter Erin Shannon [phone] at bedside to discuss discharge planning. CSW attempted to speak with patient but she was confused and unable to participate. She agreed that CSW could speak with her daughter. Daughter reported that patient was at [facility] for rehab and they would like for her to return there if possible. Daughter told CSW that Erin Shannon has contacted her regarding a bed hold but family is unsure if they want to pay to hold the bed at this time. Daughter said she wants keep the bed at [facility] but not sure if they could pay for a longer stay in the hospital. Daughter said that she plans to speak with the doctor today to get a better  idea of how long patient will need to be here. CSW explained that if they do not hold the bed and Erin Shannon is unable to take her back then they would have to look at other options and we would need to get authorization from [facility] as well. Daughter states she understands but would really like to keep the bed at [facility]. She plans to make a decision about bed hold after speaking with the doctor today. CSW left message with Erin Shannon at [facility] to see if they could take patient back at discharge. CSW will update family once she hears back. CSW will continue to follow to assist with discharge planning needs.   Employment status:  Retired Nurse, adult PT Recommendations:  Not assessed at this time Information / Referral to community resources:  [place]  Patient/Family's Response to care:  Patient is confused and only alert to self.   Patient/Family's Understanding of and Emotional Response to Diagnosis, Current Treatment, and Prognosis:  Family is very involved in patient care. Daughter Erin Shannon states that she understands patient's prognosis but has some questions for the doctors.   Emotional Assessment Appearance:  Appears stated age Attitude/Demeanor/Rapport:  Lethargic Affect (typically observed):  Appropriate, Calm Orientation:  Oriented to Self Alcohol / Substance use:  Not Applicable Psych involvement (Current and /or in the community):  No (Comment)  Discharge Needs  Concerns to be addressed:  Discharge  Planning Concerns Readmission within the last 30 days:  Yes Current discharge risk:  Cognitively Impaired, Dependent with Mobility Barriers to Discharge:  Continued Medical Work up   [employer], [place] 04/07/2018, 11:00 AM

## 2018-04-07 NOTE — Discharge Summary (Addendum)
[place] [facility] [place] NAME: Erin Shannon    MR#:  [id number]  DATE OF BIRTH:  1927-04-07  DATE OF ADMISSION:  04/06/2018 ADMITTING PHYSICIAN: Demetrios Loll, MD  DATE OF DISCHARGE:  04/07/18  PRIMARY CARE PHYSICIAN: [name], MD    ADMISSION DIAGNOSIS:  SOB (shortness of breath) [R06.02]  DISCHARGE DIAGNOSIS:  Active Problems:   Pulmonary emboli (HCC)  Nonocclusive right leg DVT SECONDARY DIAGNOSIS:   Past Medical History:  Diagnosis Date  . Arrhythmia   . Breast cancer [facility])    s/p mastectomy  . Diastolic dysfunction    echo 11/07. EF 60-65%. mild to moderate PI  . GERD (gastroesophageal reflux disease)   . HTN (hypertension)   . Hyperlipidemia   . Memory loss   . Osteoarthritis (arthritis due to wear and tear of joints)   . Palpitations    monitor with PACs/PVCs. neg myoview 2007  . Syncope and collapse     HOSPITAL COURSE:  HPI  Erin Shannon  is a 84 y.o. female with a known history of hypertension, hyperlipidemia, arrhythmia, dementia, osteoarthritis and breast cancer.  The patient was recently admitted for UTI.  She was just discharged on April 29.  She is sent to ED from assisted living due to above chief complaints.  The patient has dementia and is very poor historian.  She only complains of chest pain on the right side but denies any other symptoms.  She is found hypoxia below baseline and put on oxygen 2 L by nasal cannula.  ED physician ordered CT angiogram of the chest which show large PE but no right heart strain.  Heparin drip was started.   Bilateral PE. Patient is clinically doing fine. Denies any chest pain or shortness of breath.  Discontinued heparin drip and patient is started on Eliquis.  Patient is started on Eliquis 10 mg twice a day for a week and then decrease the dose to 5 mg twice a day Pain management as needed  venous duplex of bilateral lower extremity-nonocclusive DVT in the right superficial  femoral vein and left leg no DVT  Acute on chronic respiratory failure with hypoxia due to above.  Continue oxygen by nasal cannula ,wean off o2   Leukocytosis.    Probably reactive trending down and patient is afebrile clinically stable.  No need of antibiotics at this time  Hypertension.  Blood pressure is controlled with salt hypertension medication.  Chronic dementia: Continue Namenda  Disposition long-term care at [facility] place.  Daughter eventually is going to  higher caregivers at home and wants to take patient home  DISCHARGE CONDITIONS:   Stable   CONSULTS OBTAINED:     PROCEDURES   DRUG ALLERGIES:  No Known Allergies  DISCHARGE MEDICATIONS:   Allergies as of 04/07/2018   No Known Allergies     Medication List    STOP taking these medications   celecoxib 200 MG capsule Commonly known as:  CELEBREX     TAKE these medications   acetaminophen 325 MG tablet Commonly known as:  TYLENOL Take 2 tablets (650 mg total) by mouth every 6 (six) hours as needed for mild pain (or Fever >/= 101).   alendronate 70 MG tablet Commonly known as:  FOSAMAX Take 70 mg by mouth every Monday. Take with a full glass of water on an empty stomach.   apixaban 5 MG Tabs tablet Commonly known as:  ELIQUIS Take 2 tablets (10 mg total) by  mouth 2 (two) times daily. Take 10 mg 2 times a day for a week followed by 5 mg twice a day   CALCIUM CITRATE + D PO Take 1 tablet by mouth every evening. 630mg /500   COMPLETE WOMENS Tabs Take 1 tablet by mouth daily.   donepezil 10 MG tablet Commonly known as:  ARICEPT Take 10 mg by mouth every evening.   ibuprofen 400 MG tablet Commonly known as:  ADVIL,MOTRIN Take 1 tablet (400 mg total) by mouth every 6 (six) hours as needed for fever, headache, moderate pain or cramping.   memantine 10 MG tablet Commonly known as:  NAMENDA Take 10 mg by mouth 2 (two) times daily.   pravastatin 40 MG tablet Commonly known as:  PRAVACHOL Take 40 mg  by mouth every evening.   senna-docusate 8.6-50 MG tablet Commonly known as:  Senokot-S Take 1 tablet by mouth at bedtime as needed for mild constipation.   traMADol 50 MG tablet Commonly known as:  ULTRAM Take 1 tablet (50 mg total) by mouth every 6 (six) hours as needed.        DISCHARGE INSTRUCTIONS:  Follow-up with primary care physician at the facility in 3 to 5 days or sooner as needed   DIET:  Cardiac diet  DISCHARGE CONDITION:  Stable  ACTIVITY:  Activity as tolerated  OXYGEN:  Home Oxygen: yes   Oxygen Delivery: 1 lit o2 via [place]  DISCHARGE LOCATION:  Long-term care at [facility]  If you experience worsening of your admission symptoms, develop shortness of breath, life threatening emergency, suicidal or homicidal thoughts you must seek medical attention immediately by calling 911 or calling your MD immediately  if symptoms less severe.  You Must read complete instructions/literature along with all the possible adverse reactions/side effects for all the Medicines you take and that have been prescribed to you. Take any new Medicines after you have completely understood and accpet all the possible adverse reactions/side effects.   Please note  You were cared for by a hospitalist during your hospital stay. If you have any questions about your discharge medications or the care you received while you were in the hospital after you are discharged, you can call the unit and asked to speak with the hospitalist on call if the hospitalist that took care of you is not available. Once you are discharged, your primary care physician will handle any further medical issues. Please note that NO REFILLS for any discharge medications will be authorized once you are discharged, as it is imperative that you return to your primary care physician (or establish a relationship with a primary care physician if you do not have one) for your aftercare needs so that they can reassess your need  for medications and monitor your lab values.     Today  Chief Complaint  Patient presents with  . Chest Pain  . Shortness of Breath   Patient is resting comfortably.  Denies any complaints.  Has chronic dementia but answers few  questions appropriately  ROS: Limited from dementia CONSTITUTIONAL: Denies fevers, chills. Denies any fatigue, weakness.  RESPIRATORY: Denies cough, wheeze, shortness of breath.  CARDIOVASCULAR: Denies chest pain, palpitations, edema.  GASTROINTESTINAL: Denies nausea, vomiting, diarrhea, abdominal pain. Denies bright red blood per rectum. HEMATOLOGIC AND LYMPHATIC: Denies easy bruising or bleeding. SKIN: Denies rash or lesion. MUSCULOSKELETAL: Denies pain in neck, back, shoulder, knees, hips or arthritic symptoms.  NEUROLOGIC: Denies paralysis, paresthesias.  PSYCHIATRIC: Denies anxiety or depressive symptoms.   VITAL  SIGNS:  Blood pressure (!) 149/68, pulse 84, temperature 98.2 F (36.8 C), temperature source Oral, resp. rate 18, height 4\' 11"  (1.499 m), weight 65.8 kg (145 lb), SpO2 92 %.  I/O:    Intake/Output Summary (Last 24 hours) at 04/07/2018 1207 Last data filed at 04/07/2018 1039 Gross per 24 hour  Intake 439 ml  Output 0 ml  Net 439 ml    PHYSICAL EXAMINATION:  GENERAL:  84 y.o.-year-old patient lying in the bed with no acute distress.  EYES: Pupils equal, round, reactive to light and accommodation. No scleral icterus. Extraocular muscles intact.  HEENT: Head atraumatic, normocephalic. Oropharynx and nasopharynx clear.  NECK:  Supple, no jugular venous distention. No thyroid enlargement, no tenderness.  LUNGS: Normal breath sounds bilaterally, no wheezing, rales,rhonchi or crepitation. No use of accessory muscles of respiration.  CARDIOVASCULAR: S1, S2 normal. No murmurs, rubs, or gallops.  ABDOMEN: Soft, non-tender, non-distended. Bowel sounds present. No organomegaly or mass.  EXTREMITIES: No pedal edema, cyanosis, or clubbing.   NEUROLOGIC: Cranial nerves II through XII are intact. Muscle strength 5/5 in all extremities. Sensation intact. Gait not checked.  PSYCHIATRIC: The patient is alert and oriented x 3.  SKIN: No obvious rash, lesion, or ulcer.   DATA REVIEW:   CBC Recent Labs  Lab 04/07/18 0431  WBC 14.6*  HGB 12.9  HCT 38.1  PLT 278    Chemistries  Recent Labs  Lab 04/06/18 0946 04/07/18 0431  NA 136 138  K 4.1 4.0  CL 101 104  CO2 27 26  GLUCOSE 138* 96  BUN 13 13  CREATININE 0.98 0.88  CALCIUM 8.6* 8.2*  AST 26  --   ALT 19  --   ALKPHOS 56  --   BILITOT 0.9  --     Cardiac Enzymes Recent Labs  Lab 04/06/18 0946  TROPONINI <0.03    Microbiology Results  Results for orders placed or performed during the hospital encounter of 04/06/18  MRSA PCR Screening     Status: None   Collection Time: 04/06/18  2:48 PM  Result Value Ref Range Status   MRSA by PCR NEGATIVE NEGATIVE Final    Comment:        The GeneXpert MRSA Assay (FDA approved for NASAL specimens only), is one component of a comprehensive MRSA colonization surveillance program. It is not intended to diagnose MRSA infection nor to guide or monitor treatment for MRSA infections. Performed at [facility], [address] [place], [place] [address]     RADIOLOGY:  Ct Angio Chest Pe W And/or Wo Contrast  Result Date: 04/06/2018 CLINICAL DATA:  cardiac arrhythmia, history of EF of 55 to 60%, on last Doppler in November 2018 presents today with shortness of breath and pleuritic chest discomfort. Patient states it has been there since this morning. She does suffer from dementia most of the history as per EMS. EMS she is on 2 L of nasal cannula but they did found her with low sats there. They put on a nonrebreather and she came up immediately. EXAM: CT ANGIOGRAPHY CHEST WITH CONTRAST TECHNIQUE: Multidetector CT imaging of the chest was performed using the standard protocol during bolus administration of  intravenous contrast. Multiplanar CT image reconstructions and MIPs were obtained to evaluate the vascular anatomy. CONTRAST:  20mL ISOVUE-370 IOPAMIDOL (ISOVUE-370) INJECTION 76% COMPARISON:  05/14/1998 by report only FINDINGS: Cardiovascular: Heart size upper limits normal. No pericardial effusion. RV/LV ratio 0.75. Borderline dilatation of central pulmonary arteries. Large central and partially  occlusive segmental pulmonary emboli extending into both upper and lower lobes, more extensive right than left. Adequate contrast opacification of the thoracic aorta with no evidence of dissection, aneurysm, or stenosis. There is classic 3-vessel brachiocephalic arch anatomy without proximal stenosis. Minimal scattered atheromatous calcified plaque. Mediastinum/Nodes: No enlarged mediastinal, hilar, or axillary lymph nodes. Thyroid gland, trachea demonstrate no significant findings. Small hiatal hernia. Lungs/Pleura: Trace right pleural effusion. Consolidation/atelectasis posteriorly in the lower lobes left greater than right. Peripheral subsegmental wedge-shaped opacities in the right middle lobe. Marked asymmetric enhancement of the atelectatic portions left lower lobe. No pneumothorax. Upper Abdomen: No acute findings Musculoskeletal: Spondylitic changes at multiple levels in the lower cervical, mid and lower thoracic spine. No fracture or worrisome bone lesion. Advanced DJD in bilateral shoulders. Review of the MIP images confirms the above findings. IMPRESSION: 1. Bilateral central and segmental pulmonary emboli, right worse than left, without CT evidence of right heart strain. Critical Value/emergent results were discussed by telephone at the time of interpretation on 04/06/2018 at 11:11 am with Dr. Charlotte Crumb , who verbally acknowledged these results. 2. Trace right pleural effusion. 3.  Aortic Atherosclerosis (ICD10-170.0) 4. Small hiatal hernia Electronically Signed   By: Lucrezia Europe M.D.   On: 04/06/2018 11:13    US Venous Img Lower Bilateral  Result Date: 04/06/2018 CLINICAL DATA:  Pulmonary embolism, now with bilateral lower extremity pain. History of malignancy. Evaluate for DVT. EXAM: BILATERAL LOWER EXTREMITY VENOUS DOPPLER ULTRASOUND TECHNIQUE: Gray-scale sonography with graded compression, as well as color Doppler and duplex ultrasound were performed to evaluate the lower extremity deep venous systems from the level of the common femoral vein and including the common femoral, femoral, profunda femoral, popliteal and calf veins including the posterior tibial, peroneal and gastrocnemius veins when visible. The superficial great saphenous vein was also interrogated. Spectral Doppler was utilized to evaluate flow at rest and with distal augmentation maneuvers in the common femoral, femoral and popliteal veins. COMPARISON:  Chest CT - earlier same day FINDINGS: RIGHT LOWER EXTREMITY Common Femoral Vein: No evidence of thrombus. Normal compressibility, respiratory phasicity and response to augmentation. Saphenofemoral Junction: No evidence of thrombus. Normal compressibility and flow on color Doppler imaging. Profunda Femoral Vein: No evidence of thrombus. Normal compressibility and flow on color Doppler imaging. Femoral Vein: There is hypoechoic near occlusive thrombus within the proximal aspect of the right femoral vein (images 15 and 16). The mid and distal aspects of the right femoral vein appear widely patent. Popliteal Vein: No evidence of thrombus. Normal compressibility, respiratory phasicity and response to augmentation. Calf Veins: No evidence of thrombus. Normal compressibility and flow on color Doppler imaging. Superficial Great Saphenous Vein: No evidence of thrombus. Normal compressibility. Venous Reflux:  None. Other Findings: Note is made of an approximately 5.1 x 3.4 x 1.1 cm minimally complex fluid collection with the right popliteal fossa favored to represent a Baker cyst. LEFT LOWER EXTREMITY Common  Femoral Vein: No evidence of thrombus. Normal compressibility, respiratory phasicity and response to augmentation. Saphenofemoral Junction: No evidence of thrombus. Normal compressibility and flow on color Doppler imaging. Profunda Femoral Vein: No evidence of thrombus. Normal compressibility and flow on color Doppler imaging. Femoral Vein: No evidence of thrombus. Normal compressibility, respiratory phasicity and response to augmentation. Popliteal Vein: No evidence of thrombus. Normal compressibility, respiratory phasicity and response to augmentation. Calf Veins: No evidence of thrombus. Normal compressibility and flow on color Doppler imaging. Superficial Great Saphenous Vein: No evidence of thrombus. Normal compressibility. Venous Reflux:  None.  Other Findings:  None. IMPRESSION: 1. The examination is positive for short segment nonocclusive DVT involving the proximal aspect the right superficial femoral vein. 2. No evidence of DVT within left lower extremity. 3. Incidentally noted approximately 5.1 cm minimally complex right-sided Baker's cyst. Electronically Signed   By: Sandi Mariscal M.D.   On: 04/06/2018 16:34   Dg Chest Port 1 View  Result Date: 04/06/2018 CLINICAL DATA:  84 year old female with cardiac arrhythmia. Shortness of breath. Pleuritic chest discomfort. Initial encounter. EXAM: PORTABLE CHEST 1 VIEW COMPARISON:  03/27/2018. FINDINGS: Left-sided pleural effusion.  Left base atelectasis or infiltrate. Minimal pleural based opacity peripheral aspect right lower lobe may represent minimal atelectasis (No well-defined consolidation as seen with Hampton hump). Central pulmonary vascular prominence without pulmonary edema. Cardiomegaly. Calcified tortuous aorta. Bilateral shoulder joint degenerative changes. IMPRESSION: Left-sided pleural effusion. Left base atelectasis or infiltrate. Cardiomegaly. Aortic Atherosclerosis (ICD10-I70.0). Electronically Signed   By: Genia Del M.D.   On: 04/06/2018  10:26    EKG:   Orders placed or performed during the hospital encounter of 04/06/18  . ED EKG  . ED EKG  . EKG 12-Lead  . EKG 12-Lead      Management plans discussed with the patient, family and they are in agreement.  CODE STATUS:  Code Status History    Date Active Date Inactive Code Status Order ID Comments User Context   03/26/2018 2046 03/28/2018 1635 DNR [id number]  Vaughan Basta, MD Inpatient   12/23/2016 2202 12/25/2016 2121 Full Code [id number]  Demetrios Loll, MD ED    Questions for Most Recent Historical Code Status (Order [id number])    Question Answer Comment   In the event of cardiac or respiratory ARREST Do not call a "code blue"    In the event of cardiac or respiratory ARREST Do not perform Intubation, CPR, defibrillation or ACLS    In the event of cardiac or respiratory ARREST Use medication by any route, position, wound care, and other measures to relive pain and suffering. May use oxygen, suction and manual treatment of airway obstruction as needed for comfort.         Advance Directive Documentation     Most Recent Value  Type of Advance Directive  Healthcare Power of Attorney, Living will  Pre-existing out of facility DNR order (yellow form or pink MOST form)  -  "MOST" Form in Place?  -      TOTAL TIME TAKING CARE OF THIS PATIENT: 43  minutes.   Note: This dictation was prepared with Dragon dictation along with smaller phrase technology. Any transcriptional errors that result from this process are unintentional.   @MEC @  on 04/07/2018 at 12:07 PM  Between 7am to 6pm - Pager - [phone]  After 6pm go to [email or web address] - password [email or web address] [facility]  [place] [facility]  Office  [phone]  CC: Primary care physician; [name], MD

## 2018-04-07 NOTE — Progress Notes (Signed)
[place] for heparin Indication: pulmonary embolus  No Known Allergies  Patient Measurements: Height: 4\' 11"  (149.9 cm) Weight: 145 lb (65.8 kg) IBW/kg (Calculated) : 43.2 Heparin Dosing Weight: 57.5 kg  Vital Signs: Temp: 98.2 F (36.8 C) (05/09 0459) Temp Source: Oral (05/09 0459) BP: 149/68 (05/09 0459) Pulse Rate: 84 (05/09 0459)  Labs: Recent Labs    04/06/18 0946 04/06/18 1919 04/07/18 0431  HGB 14.0  --  12.9  HCT 41.3  --  38.1  PLT 292  --  278  APTT 28  --   --   LABPROT 13.6  --   --   INR 1.05  --   --   HEPARINUNFRC  --  0.78* 0.71*  CREATININE 0.98  --  0.88  TROPONINI <0.03  --   --     Estimated Creatinine Clearance: 35.7 mL/min (by C-G formula based on SCr of 0.88 mg/dL).   Medical History: Past Medical History:  Diagnosis Date  . Arrhythmia   . Breast cancer [facility])    s/p mastectomy  . Diastolic dysfunction    echo 11/07. EF 60-65%. mild to moderate PI  . GERD (gastroesophageal reflux disease)   . HTN (hypertension)   . Hyperlipidemia   . Memory loss   . Osteoarthritis (arthritis due to wear and tear of joints)   . Palpitations    monitor with PACs/PVCs. neg myoview 2007  . Syncope and collapse     Assessment: 84yo female on no anticoagulants PTA and >25% over IBW  Goal of Therapy:  Heparin level 0.3-0.7 units/ml Monitor platelets by anticoagulation protocol: Yes   Plan:  05/09 @ 0500 HL 0.71 slightly above goal. Will decrease to 850 units/hr and recheck @ 1300. Hgb trended down by one unit, will continue to monitor.  Tobie Lords, PharmD, BCPS Clinical Pharmacist 04/07/2018

## 2018-04-07 NOTE — Progress Notes (Signed)
Family Meeting Note  Advance Directive:yes  Today a meeting took place with the Patient, daughter at bedside  Patient is unable to participate in the conversation because of her chronic dementia      The following clinical team members were present during this meeting:MD  The following were discussed:Patient's diagnosis: Acute respiratory failure with hypoxia secondary to pulmonary embolism, history of breast cancer, nonocclusive right lower extremity DVT, hypertension, chronic dementia, treatment plan of care discussed in detail with the patient and daughter at bedside.  She verbalized understanding of the plan  patient's progosis: Unable to determine and Goals for treatment: Full Code, daughter Jacqulyn Liner is the healthcare POA  Additional follow-up to be provided: Hospitalist  Time spent during discussion:17 min  Nicholes Mango, MD

## 2018-04-07 NOTE — Care Management (Signed)
RNCM consult for eliquis.  Patient is discharging to SNF.  CSW facilitating.  RNCM signing off

## 2018-04-07 NOTE — NC FL2 (Signed)
[place] LEVEL OF CARE SCREENING TOOL     IDENTIFICATION  Patient Name: Erin Shannon Birthdate: 17-Apr-1927 Sex: female Admission Date (Current Location): 04/06/2018  [place] and [place] Number:  Engineering geologist and Address:  [facility], [address] [place], [place] [address]      Provider Number: [id number]  Attending Physician Name and Address:  Nicholes Mango, MD  Relative Name and Phone Number:       Current Level of Care: Hospital Recommended Level of Care: [place] Prior Approval Number:    Date Approved/Denied:   PASRR Number: [id number]  Discharge Plan: SNF    Current Diagnoses: Patient Active Problem List   Diagnosis Date Noted  . Pulmonary emboli ([place]) 04/06/2018  . Acute lower UTI 03/26/2018  . UTI (urinary tract infection) 12/24/2016  . Mobitz (type) I (Wenckebach's) atrioventricular block 12/24/2016  . Demand ischemia ([place]) 12/24/2016  . Weakness 12/24/2016  . ARF (acute renal failure) ([place]) 12/23/2016  . Chronic venous insufficiency 07/27/2014  . BRADYCARDIA 12/19/2010  . DIASTOLIC HEART FAILURE, CHRONIC 12/19/2010  . HYPERTENSION, BENIGN 02/06/2009  . PALPITATIONS 02/06/2009    Orientation RESPIRATION BLADDER Height & Weight     Self, Place  O2(2 liters) Continent Weight: 145 lb (65.8 kg) Height:  4\' 11"  (149.9 cm)  BEHAVIORAL SYMPTOMS/MOOD NEUROLOGICAL BOWEL NUTRITION STATUS  (none) (None) Continent Diet(Full Liquid diet )  AMBULATORY STATUS COMMUNICATION OF NEEDS Skin   Extensive Assist Verbally Normal                       Personal Care Assistance Level of Assistance  Bathing, Feeding, Dressing Bathing Assistance: Limited assistance Feeding assistance: Independent Dressing Assistance: Limited assistance     Functional Limitations Info  Speech, Hearing, Sight Sight Info: Adequate Hearing Info: Adequate Speech Info: Adequate    SPECIAL CARE FACTORS FREQUENCY  PT  (By licensed PT), OT (By licensed OT)                    Contractures Contractures Info: Not present    Additional Factors Info  Code Status, Allergies Code Status Info: DNR Allergies Info: NKA           Current Medications (04/07/2018):  This is the current hospital active medication list Current Facility-Administered Medications  Medication Dose Route Frequency Provider Last Rate Last Dose  . 0.9 %  sodium chloride infusion  250 mL Intravenous PRN Demetrios Loll, MD      . acetaminophen (TYLENOL) tablet 650 mg  650 mg Oral Q6H PRN Demetrios Loll, MD       Or  . acetaminophen (TYLENOL) suppository 650 mg  650 mg Rectal Q6H PRN Demetrios Loll, MD      . albuterol (PROVENTIL) (2.5 MG/3ML) 0.083% nebulizer solution 2.5 mg  2.5 mg Nebulization Q2H PRN Demetrios Loll, MD      . bisacodyl (DULCOLAX) EC tablet 5 mg  5 mg Oral Daily PRN Demetrios Loll, MD      . donepezil (ARICEPT) tablet 10 mg  10 mg Oral QPM Demetrios Loll, MD   10 mg at 04/06/18 1846  . heparin ADULT infusion 100 units/mL (25000 units/253mL sodium chloride 0.45%)  850 Units/hr Intravenous Continuous Demetrios Loll, MD 8.5 mL/hr at 04/07/18 0558 850 Units/hr at 04/07/18 0558  . HYDROcodone-acetaminophen (NORCO/VICODIN) 5-325 MG per tablet 1-2 tablet  1-2 tablet Oral Q4H PRN Demetrios Loll, MD      . memantine [facility]) tablet  10 mg  10 mg Oral BID Demetrios Loll, MD   10 mg at 04/06/18 2107  . ondansetron (ZOFRAN) tablet 4 mg  4 mg Oral Q6H PRN Demetrios Loll, MD       Or  . ondansetron [facility]) injection 4 mg  4 mg Intravenous Q6H PRN Demetrios Loll, MD      . pneumococcal 23 valent vaccine (PNU-IMMUNE) injection 0.5 mL  0.5 mL Intramuscular Tomorrow-1000 Demetrios Loll, MD      . pravastatin (PRAVACHOL) tablet 40 mg  40 mg Oral QPM Demetrios Loll, MD   40 mg at 04/06/18 1846  . senna-docusate (Senokot-S) tablet 1 tablet  1 tablet Oral QHS PRN Demetrios Loll, MD      . sodium chloride flush (NS) 0.9 % injection 3 mL  3 mL Intravenous Q12H Demetrios Loll, MD   3 mL at  04/06/18 2205  . sodium chloride flush (NS) 0.9 % injection 3 mL  3 mL Intravenous PRN Demetrios Loll, MD         Discharge Medications: Please see discharge summary for a list of discharge medications.  Relevant Imaging Results:  Relevant Lab Results:   Additional Information SSN [id number]  Annamaria Boots, [place]

## 2018-04-07 NOTE — Discharge Instructions (Signed)
Follow-up with primary care physician at the facility in 3 to 5 days or sooner as needed

## 2018-04-07 NOTE — Clinical Social Work Note (Signed)
CSW spoke with Sharyn Lull at [facility] and patient can return when ready for discharge. CSW was also notified by MD that patient will be ready to discharge today. CSW notified Sharyn Lull at [facility] and patient daughter [name] [phone]. Patient will be transported by EMS and RN will call report.   Annamaria Boots MSW, [place]  [phone]

## 2018-04-07 NOTE — Progress Notes (Signed)
[place] for apixaban Indication: pulmonary embolus  No Known Allergies  Patient Measurements: Height: 4\' 11"  (149.9 cm) Weight: 145 lb (65.8 kg) IBW/kg (Calculated) : 43.2  Vital Signs: Temp: 98.4 F (36.9 C) (05/09 1300) Temp Source: Oral (05/09 1300) BP: 125/61 (05/09 1300) Pulse Rate: 77 (05/09 1300)  Labs: Recent Labs    04/06/18 0946 04/06/18 1919 04/07/18 0431  HGB 14.0  --  12.9  HCT 41.3  --  38.1  PLT 292  --  278  APTT 28  --   --   LABPROT 13.6  --   --   INR 1.05  --   --   HEPARINUNFRC  --  0.78* 0.71*  CREATININE 0.98  --  0.88  TROPONINI <0.03  --   --     Estimated Creatinine Clearance: 35.7 mL/min (by C-G formula based on SCr of 0.88 mg/dL).   Medical History: Past Medical History:  Diagnosis Date  . Arrhythmia   . Breast cancer [facility])    s/p mastectomy  . Diastolic dysfunction    echo 11/07. EF 60-65%. mild to moderate PI  . GERD (gastroesophageal reflux disease)   . HTN (hypertension)   . Hyperlipidemia   . Memory loss   . Osteoarthritis (arthritis due to wear and tear of joints)   . Palpitations    monitor with PACs/PVCs. neg myoview 2007  . Syncope and collapse     Assessment: 84 y/o F with PE to transition from heparin infusion to apixaban.   Plan:  Apixaban 10 mg bid x 7 days followed by 5 mg bid.   Ulice Dash, PharmD Clinical Pharmacist  04/07/2018

## 2018-04-08 ENCOUNTER — Non-Acute Institutional Stay (SKILLED_NURSING_FACILITY): Payer: Medicare Other | Admitting: Gerontology

## 2018-04-08 DIAGNOSIS — R531 Weakness: Secondary | ICD-10-CM | POA: Diagnosis not present

## 2018-04-08 DIAGNOSIS — M159 Polyosteoarthritis, unspecified: Secondary | ICD-10-CM

## 2018-04-08 DIAGNOSIS — I2699 Other pulmonary embolism without acute cor pulmonale: Secondary | ICD-10-CM | POA: Diagnosis not present

## 2018-04-11 ENCOUNTER — Other Ambulatory Visit
Admission: RE | Admit: 2018-04-11 | Discharge: 2018-04-11 | Disposition: A | Discharge status: Home / Self Care | Payer: Medicare Other | Source: Other Acute Inpatient Hospital | Attending: Gerontology | Admitting: Gerontology

## 2018-04-11 DIAGNOSIS — I2699 Other pulmonary embolism without acute cor pulmonale: Secondary | ICD-10-CM | POA: Diagnosis present

## 2018-04-11 LAB — CBC WITH DIFFERENTIAL/PLATELET
Basophils Absolute: 0.1 10*3/uL (ref 0–0.1)
Basophils Relative: 1 %
EOS ABS: 0.3 10*3/uL (ref 0–0.7)
EOS PCT: 3 %
HCT: 39.8 % (ref 35.0–47.0)
Hemoglobin: 13.3 g/dL (ref 12.0–16.0)
LYMPHS ABS: 1.6 10*3/uL (ref 1.0–3.6)
Lymphocytes Relative: 14 %
MCH: 31 pg (ref 26.0–34.0)
MCHC: 33.4 g/dL (ref 32.0–36.0)
MCV: 92.9 fL (ref 80.0–100.0)
Monocytes Absolute: 0.9 10*3/uL (ref 0.2–0.9)
Monocytes Relative: 8 %
Neutro Abs: 8.4 10*3/uL — ABNORMAL HIGH (ref 1.4–6.5)
Neutrophils Relative %: 74 %
PLATELETS: 355 10*3/uL (ref 150–440)
RBC: 4.29 MIL/uL (ref 3.80–5.20)
RDW: 13.4 % (ref 11.5–14.5)
WBC: 11.4 10*3/uL — ABNORMAL HIGH (ref 3.6–11.0)

## 2018-04-11 LAB — COMPREHENSIVE METABOLIC PANEL
ALT: 16 U/L (ref 14–54)
ANION GAP: 9 (ref 5–15)
AST: 22 U/L (ref 15–41)
Albumin: 3 g/dL — ABNORMAL LOW (ref 3.5–5.0)
Alkaline Phosphatase: 56 U/L (ref 38–126)
BUN: 13 mg/dL (ref 6–20)
CHLORIDE: 103 mmol/L (ref 101–111)
CO2: 26 mmol/L (ref 22–32)
Calcium: 8.9 mg/dL (ref 8.9–10.3)
Creatinine, Ser: 0.79 mg/dL (ref 0.44–1.00)
GFR calc non Af Amer: >60 mL/min (ref >60)
Glucose, Bld: 77 mg/dL (ref 65–99)
POTASSIUM: 4.2 mmol/L (ref 3.5–5.1)
SODIUM: 138 mmol/L (ref 135–145)
Total Bilirubin: 0.6 mg/dL (ref 0.3–1.2)
Total Protein: 6.6 g/dL (ref 6.5–8.1)

## 2018-04-18 ENCOUNTER — Non-Acute Institutional Stay (SKILLED_NURSING_FACILITY): Payer: Medicare Other | Admitting: Gerontology

## 2018-04-18 ENCOUNTER — Encounter: Payer: Self-pay | Admitting: Gerontology

## 2018-04-18 DIAGNOSIS — R531 Weakness: Secondary | ICD-10-CM | POA: Diagnosis not present

## 2018-04-18 DIAGNOSIS — I2699 Other pulmonary embolism without acute cor pulmonale: Secondary | ICD-10-CM

## 2018-04-18 DIAGNOSIS — M159 Polyosteoarthritis, unspecified: Secondary | ICD-10-CM

## 2018-04-18 NOTE — Progress Notes (Signed)
Location:   [facility] Room Number: [place] of Service:  SNF [id number]) Provider:  Toni Arthurs, NP-C  [name], MD  Patient Care Team: [name], MD as PCP - General (Family Medicine)  Extended Emergency Contact Information Primary Emergency Contact: [name] Address: [place], [place] of [place] Phone: [phone] Work Phone: [phone] Relation: Daughter Secondary Emergency Contact: [place], [place] Work Phone: [phone] Mobile Phone: [phone] Relation: Son  Code Status:  FULL Goals of care: Advanced Directive information Advanced Directives 04/18/2018  Does Patient Have a Medical Advance Directive? Yes  Type of Paramedic of [place];Living will  Does patient want to make changes to medical advance directive? -  Copy of [place] in Chart? No - copy requested     Chief Complaint  Patient presents with  . Medical Management of Chronic Issues    Routine Visit    HPI:  Pt is a 84 y.o. female seen today for medical management of chronic diseases. Pt was admitted to the facility for rehab following hospitalization for severe right ankle and knee pain after a fall at home. Pt was found to have a UTI (resolved) but no fractures of the knee or ankle. Shortly after admission to facility, pt developed severe dyspnea and chest pain. Pt returned to ED and was found to have PEs. Pt was stabilized on anticoagulants and has since returned to the facility to finish her PT. Today, pt is confused at her baseline. She has been participating inPT/OT. She is at the nurses station most of the time for observation d/t confusion. C/O intermittent pain to the leg, relieved by Tramadol. Leg has acewrap for support. Elevated. Pt remains on O2 2L [place]. Denies chest pain or shortness of breath. Pt is on Eliquis for PE treatment. VSS. No other complaints.       Past Medical  History:  Diagnosis Date  . Arrhythmia   . Arthritis   . Breast cancer [facility])    s/p mastectomy  . Cancer ([place])    breast  . Chronic kidney disease    stones  . Dementia   . Diastolic dysfunction    echo 11/07. EF 60-65%. mild to moderate PI  . GERD (gastroesophageal reflux disease)   . HTN (hypertension)   . Hyperlipidemia   . Hypertension   . Memory loss   . Osteoarthritis (arthritis due to wear and tear of joints)   . Osteoporosis, post-menopausal   . Palpitations    monitor with PACs/PVCs. neg myoview 2007  . Syncope and collapse    Past Surgical History:  Procedure Laterality Date  . hysterectomy-unspecified area    . masectomy     R    No Known Allergies  Allergies as of 04/18/2018   No Known Allergies     Medication List        Accurate as of 04/18/18  2:04 PM. Always use your most recent med list.          acetaminophen 325 MG tablet Commonly known as:  TYLENOL Take 2 tablets (650 mg total) by mouth every 6 (six) hours as needed for mild pain (or Fever >/= 101).   alendronate 70 MG tablet Commonly known as:  FOSAMAX Take 70 mg by mouth every Monday. Take with a full glass of water on an empty stomach.   apixaban 5 MG Tabs tablet Commonly known as:  ELIQUIS Take 5 mg by mouth 2 (two) times daily.   calcium citrate-vitamin D 315-200 MG-UNIT tablet Commonly known as:  CITRACAL+D Take 1 tablet by mouth daily.   COMPLETE WOMENS Tabs Take 1 tablet by mouth daily.   diltiazem 240 MG 24 hr capsule Commonly known as:  DILACOR XR Take 240 mg by mouth daily.   donepezil 10 MG tablet Commonly known as:  ARICEPT Take 10 mg by mouth every evening.   memantine 10 MG tablet Commonly known as:  NAMENDA Take 10 mg by mouth 2 (two) times daily.   pravastatin 40 MG tablet Commonly known as:  PRAVACHOL Take 40 mg by mouth every evening.   senna-docusate 8.6-50 MG tablet Commonly known as:  Senokot-S Take 1 tablet by mouth at bedtime as needed for mild  constipation.   traMADol 50 MG tablet Commonly known as:  ULTRAM Take 1 tablet (50 mg total) by mouth every 6 (six) hours as needed.       Review of Systems  Constitutional: Negative for activity change, appetite change, chills, diaphoresis and fever.  HENT: Negative for congestion, mouth sores, nosebleeds, postnasal drip, sneezing, sore throat, trouble swallowing and voice change.   Respiratory: Negative for apnea, cough, choking, chest tightness, shortness of breath and wheezing.   Cardiovascular: Negative for chest pain, palpitations and leg swelling.  Gastrointestinal: Negative for abdominal distention, abdominal pain, constipation, diarrhea and nausea.  Genitourinary: Negative for difficulty urinating, dysuria, frequency and urgency.  Musculoskeletal: Positive for arthralgias (typical arthritis), gait problem, joint swelling and myalgias. Negative for back pain.  Skin: Negative for color change, pallor, rash and wound.  Neurological: Positive for weakness. Negative for dizziness, tremors, syncope, speech difficulty, numbness and headaches.  Psychiatric/Behavioral: Positive for confusion. Negative for agitation and behavioral problems.  All other systems reviewed and are negative.   Immunization History  Administered Date(s) Administered  . Influenza,inj,Quad PF,6+ Mos 09/21/2017  . Influenza-Unspecified 11/13/2015  . Pneumococcal Conjugate-13 11/11/2013  . Pneumococcal Polysaccharide-23 04/07/2018   Pertinent  Health Maintenance Due  Topic Date Due  . DEXA SCAN  03/14/1994  . INFLUENZA VACCINE  06/30/2018  . PNA vac Low Risk Adult (2 of 2 - PCV13) 04/08/2019   No flowsheet data found. Functional Status Survey:    Vitals:   04/18/18 1321  BP: (!) 146/71  Pulse: 78  Resp: 18  Temp: 97.6 F (36.4 C)  TempSrc: Oral  SpO2: 95%  Weight: 132 lb 9.6 oz (60.1 kg)  Height: 4\' 11"  (1.499 m)   Body mass index is 26.78 kg/m. Physical Exam  Constitutional: Vital signs  are normal. She appears well-developed and well-nourished. She is active and cooperative. She does not appear ill. No distress. Nasal cannula in place.  HENT:  Head: Normocephalic and atraumatic.  Mouth/Throat: Uvula is midline, oropharynx is clear and moist and mucous membranes are normal. Mucous membranes are not pale, not dry and not cyanotic.  Eyes: Pupils are equal, round, and reactive to light. Conjunctivae, EOM and lids are normal.  Neck: Trachea normal, normal range of motion and full passive range of motion without pain. Neck supple. No JVD present. No tracheal deviation, no edema and no erythema present. No thyromegaly present.  Cardiovascular: Normal rate, normal heart sounds, intact distal pulses and normal pulses. An irregular rhythm present. Exam reveals no gallop, no distant heart sounds and no friction rub.  No murmur heard. Pulses:      Dorsalis pedis pulses are 2+ on the right side, and 2+ on  the left side.  No edema  Pulmonary/Chest: Effort normal and breath sounds normal. No accessory muscle usage. No respiratory distress. She has no decreased breath sounds. She has no wheezes. She has no rhonchi. She has no rales. She exhibits no tenderness.  Abdominal: Soft. Normal appearance and bowel sounds are normal. She exhibits no distension and no ascites. There is no tenderness.  Musculoskeletal: Normal range of motion. She exhibits no edema.       Right knee: Tenderness found.       Right ankle: Tenderness.  Expected osteoarthritis, stiffness; Bilateral Calves soft, supple. Negative Homan's Sign. B- pedal pulses equal; generalized weakness  Neurological: She is alert. She has normal strength. She exhibits abnormal muscle tone. Gait abnormal.  Skin: Skin is warm, dry and intact. She is not diaphoretic. No cyanosis. No pallor. Nails show no clubbing.  Psychiatric: She has a normal mood and affect. Her speech is normal and behavior is normal. Thought content normal. Cognition and memory  are impaired. She expresses impulsivity. She exhibits abnormal recent memory.  Nursing note and vitals reviewed.   Labs reviewed: Recent Labs    04/06/18 0946 04/07/18 0431 04/11/18 0400  NA 136 138 138  K 4.1 4.0 4.2  CL 101 104 103  CO2 27 26 26   GLUCOSE 138* 96 77  BUN 13 13 13   CREATININE 0.98 0.88 0.79  CALCIUM 8.6* 8.2* 8.9   Recent Labs    03/26/18 1533 04/06/18 0946 04/11/18 0400  AST 43* 26 22  ALT 26 19 16   ALKPHOS 64 56 56  BILITOT 0.9 0.9 0.6  PROT 8.0 6.8 6.6  ALBUMIN 3.7 3.1* 3.0*   Recent Labs    03/26/18 1533  04/06/18 0946 04/07/18 0431 04/11/18 0400  WBC 15.6*   < > 18.2* 14.6* 11.4*  NEUTROABS 12.7*  --  15.4*  --  8.4*  HGB 15.1   < > 14.0 12.9 13.3  HCT 45.0   < > 41.3 38.1 39.8  MCV 93.3   < > 92.6 91.8 92.9  PLT 257   < > 292 278 355   < > = values in this interval not displayed.   Lab Results  Component Value Date   TSH 1.374 12/24/2016   No results found for: HGBA1C No results found for: CHOL, HDL, LDLCALC, LDLDIRECT, TRIG, CHOLHDL  Significant Diagnostic Results in last 30 days:  Dg Chest 1 View  Result Date: 03/27/2018 CLINICAL DATA:  Hypoxia. Altered mental status. Pertinent history of breast carcinoma. EXAM: CHEST  1 VIEW COMPARISON:  03/26/2018 FINDINGS: Heart size remains within normal limits. Small left pleural effusion and mild left basilar atelectasis show no significant change. Right lung is clear. Previous right mastectomy and axillary lymph node dissection. IMPRESSION: Stable small left pleural effusion and mild left basilar atelectasis. Electronically Signed   By: Earle Gell M.D.   On: 03/27/2018 09:01   Dg Ankle Complete Right  Result Date: 03/26/2018 CLINICAL DATA:  Status post fall several days ago.  Pain. EXAM: RIGHT ANKLE - COMPLETE 3+ VIEW COMPARISON:  None. FINDINGS: Marked soft tissue swelling in the ankle, lateral greater than medial. Vascular calcifications. No acute fractures are seen. No other acute  abnormalities. IMPRESSION: Soft tissue swelling.  No identified fracture. Electronically Signed   By: Dorise Bullion III M.D   On: 03/26/2018 16:34   Ct Angio Chest Pe W And/or Wo Contrast  Result Date: 04/06/2018 CLINICAL DATA:  cardiac arrhythmia, history of EF of 55 to 60%,  on last Doppler in November 2018 presents today with shortness of breath and pleuritic chest discomfort. Patient states it has been there since this morning. She does suffer from dementia most of the history as per EMS. EMS she is on 2 L of nasal cannula but they did found her with low sats there. They put on a nonrebreather and she came up immediately. EXAM: CT ANGIOGRAPHY CHEST WITH CONTRAST TECHNIQUE: Multidetector CT imaging of the chest was performed using the standard protocol during bolus administration of intravenous contrast. Multiplanar CT image reconstructions and MIPs were obtained to evaluate the vascular anatomy. CONTRAST:  31mL ISOVUE-370 IOPAMIDOL (ISOVUE-370) INJECTION 76% COMPARISON:  05/14/1998 by report only FINDINGS: Cardiovascular: Heart size upper limits normal. No pericardial effusion. RV/LV ratio 0.75. Borderline dilatation of central pulmonary arteries. Large central and partially occlusive segmental pulmonary emboli extending into both upper and lower lobes, more extensive right than left. Adequate contrast opacification of the thoracic aorta with no evidence of dissection, aneurysm, or stenosis. There is classic 3-vessel brachiocephalic arch anatomy without proximal stenosis. Minimal scattered atheromatous calcified plaque. Mediastinum/Nodes: No enlarged mediastinal, hilar, or axillary lymph nodes. Thyroid gland, trachea demonstrate no significant findings. Small hiatal hernia. Lungs/Pleura: Trace right pleural effusion. Consolidation/atelectasis posteriorly in the lower lobes left greater than right. Peripheral subsegmental wedge-shaped opacities in the right middle lobe. Marked asymmetric enhancement of the  atelectatic portions left lower lobe. No pneumothorax. Upper Abdomen: No acute findings Musculoskeletal: Spondylitic changes at multiple levels in the lower cervical, mid and lower thoracic spine. No fracture or worrisome bone lesion. Advanced DJD in bilateral shoulders. Review of the MIP images confirms the above findings. IMPRESSION: 1. Bilateral central and segmental pulmonary emboli, right worse than left, without CT evidence of right heart strain. Critical Value/emergent results were discussed by telephone at the time of interpretation on 04/06/2018 at 11:11 am with Dr. Charlotte Crumb , who verbally acknowledged these results. 2. Trace right pleural effusion. 3.  Aortic Atherosclerosis (ICD10-170.0) 4. Small hiatal hernia Electronically Signed   By: Lucrezia Europe M.D.   On: 04/06/2018 11:13   Mr Ankle Right Wo Contrast  Result Date: 03/26/2018 CLINICAL DATA:  Right ankle pain after fall on Monday. EXAM: MRI OF THE RIGHT ANKLE WITHOUT CONTRAST TECHNIQUE: Multiplanar, multisequence MR imaging of the ankle was performed. No intravenous contrast was administered. COMPARISON:  None. FINDINGS: TENDONS Peroneal: Intact peroneus longus and peroneus brevis tendons. Posteromedial: Intact tibialis posterior, flexor hallucis longus and flexor digitorum longus tendons. Anterior: Intact tibialis anterior, extensor hallucis longus and extensor digitorum longus tendons. Achilles: Intact. Plantar Fascia: Intact. LIGAMENTS Lateral: Intact Medial: Intact CARTILAGE Ankle Joint: No focal chondral defect. Small posterior joint effusion. Subtalar Joints/Sinus Tarsi: Intact Bones: No marrow edema or fracture. No joint dislocation. Calcaneal enthesopathy along the plantar aspect. Other: Diffuse periarticular soft tissue swelling of the ankle and included foot. IMPRESSION: Soft tissue edema of the ankle and foot. No fracture or malalignment. Intact tendons and ligaments crossing the ankle joint. Electronically Signed   By: Ashley Royalty  M.D.   On: 03/26/2018 20:40   US Venous Img Lower Bilateral  Result Date: 04/06/2018 CLINICAL DATA:  Pulmonary embolism, now with bilateral lower extremity pain. History of malignancy. Evaluate for DVT. EXAM: BILATERAL LOWER EXTREMITY VENOUS DOPPLER ULTRASOUND TECHNIQUE: Gray-scale sonography with graded compression, as well as color Doppler and duplex ultrasound were performed to evaluate the lower extremity deep venous systems from the level of the common femoral vein and including the common femoral, femoral, profunda femoral,  popliteal and calf veins including the posterior tibial, peroneal and gastrocnemius veins when visible. The superficial great saphenous vein was also interrogated. Spectral Doppler was utilized to evaluate flow at rest and with distal augmentation maneuvers in the common femoral, femoral and popliteal veins. COMPARISON:  Chest CT - earlier same day FINDINGS: RIGHT LOWER EXTREMITY Common Femoral Vein: No evidence of thrombus. Normal compressibility, respiratory phasicity and response to augmentation. Saphenofemoral Junction: No evidence of thrombus. Normal compressibility and flow on color Doppler imaging. Profunda Femoral Vein: No evidence of thrombus. Normal compressibility and flow on color Doppler imaging. Femoral Vein: There is hypoechoic near occlusive thrombus within the proximal aspect of the right femoral vein (images 15 and 16). The mid and distal aspects of the right femoral vein appear widely patent. Popliteal Vein: No evidence of thrombus. Normal compressibility, respiratory phasicity and response to augmentation. Calf Veins: No evidence of thrombus. Normal compressibility and flow on color Doppler imaging. Superficial Great Saphenous Vein: No evidence of thrombus. Normal compressibility. Venous Reflux:  None. Other Findings: Note is made of an approximately 5.1 x 3.4 x 1.1 cm minimally complex fluid collection with the right popliteal fossa favored to represent a Baker cyst.  LEFT LOWER EXTREMITY Common Femoral Vein: No evidence of thrombus. Normal compressibility, respiratory phasicity and response to augmentation. Saphenofemoral Junction: No evidence of thrombus. Normal compressibility and flow on color Doppler imaging. Profunda Femoral Vein: No evidence of thrombus. Normal compressibility and flow on color Doppler imaging. Femoral Vein: No evidence of thrombus. Normal compressibility, respiratory phasicity and response to augmentation. Popliteal Vein: No evidence of thrombus. Normal compressibility, respiratory phasicity and response to augmentation. Calf Veins: No evidence of thrombus. Normal compressibility and flow on color Doppler imaging. Superficial Great Saphenous Vein: No evidence of thrombus. Normal compressibility. Venous Reflux:  None. Other Findings:  None. IMPRESSION: 1. The examination is positive for short segment nonocclusive DVT involving the proximal aspect the right superficial femoral vein. 2. No evidence of DVT within left lower extremity. 3. Incidentally noted approximately 5.1 cm minimally complex right-sided Baker's cyst. Electronically Signed   By: Sandi Mariscal M.D.   On: 04/06/2018 16:34   US Venous Img Lower Unilateral Right  Result Date: 03/26/2018 CLINICAL DATA:  Fall.  Edema. EXAM: RIGHT LOWER EXTREMITY VENOUS DOPPLER ULTRASOUND TECHNIQUE: Gray-scale sonography with graded compression, as well as color Doppler and duplex ultrasound were performed to evaluate the lower extremity deep venous systems from the level of the common femoral vein and including the common femoral, femoral, profunda femoral, popliteal and calf veins including the posterior tibial, peroneal and gastrocnemius veins when visible. The superficial great saphenous vein was also interrogated. Spectral Doppler was utilized to evaluate flow at rest and with distal augmentation maneuvers in the common femoral, femoral and popliteal veins. COMPARISON:  None. FINDINGS: Contralateral Common  Femoral Vein: Respiratory phasicity is normal and symmetric with the symptomatic side. No evidence of thrombus. Normal compressibility. Common Femoral Vein: No evidence of thrombus. Normal compressibility, respiratory phasicity and response to augmentation. Saphenofemoral Junction: No evidence of thrombus. Normal compressibility and flow on color Doppler imaging. Profunda Femoral Vein: No evidence of thrombus. Normal compressibility and flow on color Doppler imaging. Femoral Vein: No evidence of thrombus. Normal compressibility, respiratory phasicity and response to augmentation. Popliteal Vein: No evidence of thrombus. Normal compressibility, respiratory phasicity and response to augmentation. Calf Veins: No evidence of thrombus. Normal compressibility and flow on color Doppler imaging. Superficial Great Saphenous Vein: No evidence of thrombus. Normal compressibility. Venous Reflux:  None. Other Findings: There is a Engineer, agricultural cyst measuring 6.2 x 2.1 x 4.0 cm. IMPRESSION: 6.2 x 2.1 x 4.0 cm right popliteal Baker's cyst.  No DVT. Electronically Signed   By: Dorise Bullion III M.D   On: 03/26/2018 16:37   Dg Chest Port 1 View  Result Date: 04/06/2018 CLINICAL DATA:  84 year old female with cardiac arrhythmia. Shortness of breath. Pleuritic chest discomfort. Initial encounter. EXAM: PORTABLE CHEST 1 VIEW COMPARISON:  03/27/2018. FINDINGS: Left-sided pleural effusion.  Left base atelectasis or infiltrate. Minimal pleural based opacity peripheral aspect right lower lobe may represent minimal atelectasis (No well-defined consolidation as seen with Hampton hump). Central pulmonary vascular prominence without pulmonary edema. Cardiomegaly. Calcified tortuous aorta. Bilateral shoulder joint degenerative changes. IMPRESSION: Left-sided pleural effusion. Left base atelectasis or infiltrate. Cardiomegaly. Aortic Atherosclerosis (ICD10-I70.0). Electronically Signed   By: Genia Del M.D.   On: 04/06/2018 10:26   Dg  Chest Port 1 View  Result Date: 03/26/2018 CLINICAL DATA:  Status post fall.  Pain. EXAM: PORTABLE CHEST 1 VIEW COMPARISON:  December 23, 2016 FINDINGS: Small left effusion with atelectasis. The heart, hila, mediastinum, lungs, and pleura are otherwise normal. Degenerative changes in the shoulders. IMPRESSION: Small left effusion with underlying opacity, likely atelectasis. No other acute abnormality. Electronically Signed   By: Dorise Bullion III M.D   On: 03/26/2018 16:35   Dg Knee Complete 4 Views Right  Result Date: 03/26/2018 CLINICAL DATA:  Pain after fall EXAM: RIGHT KNEE - COMPLETE 4+ VIEW COMPARISON:  None. FINDINGS: Severe tricompartmental degenerative changes. No acute fractures are seen. No joint effusion noted. Chondrocalcinosis. IMPRESSION: Chondrocalcinosis consistent with CPPD. Severe degenerative changes. No fracture or effusion. Electronically Signed   By: Dorise Bullion III M.D   On: 03/26/2018 16:35    Assessment/Plan  Acute pulmonary embolism without acute cor pulmonale, unspecified pulmonary embolism type (HCC)  Weakness  Osteoarthritis involving multiple joints on both sides of body   Continue PT/OT  Continue exercises as taught by PT/OT  Continue O2 2L [place]  Continue Eliquis 5 mg po BID for PE treatment/DVT prophylaxis  Continue Acewrap to the leg, remove daily for skin assessment  Ice pack prn for pain  Continue Tramadol 50 mg po Q 6 hours prn pain  Continue Tylenol 650 mg po Q 6 hours prn pain  Ambulate with walker, with assistance  Monitor for safety  Fall precautions  Follow up with Orthopedist as instructed  Family/ staff Communication:   Total Time:  Documentation:  Face to Face:  Family/Phone:   Labs/tests ordered:    Medication list reviewed and assessed for continued appropriateness. Monthly medication orders reviewed and signed.  Vikki Ports, NP-C Geriatrics [facility] [phone], [place] [id number] Cell Phone (Mon-Fri 8am-5pm):  [phone] On Call:  [phone] & follow prompts after 5pm & weekends Office Phone:  [phone] Office Fax:  [phone]

## 2018-04-23 DIAGNOSIS — M159 Polyosteoarthritis, unspecified: Secondary | ICD-10-CM | POA: Insufficient documentation

## 2018-04-23 NOTE — Assessment & Plan Note (Signed)
Progressive weakness. Had fall at home. Was also found to have UTI in hospital. Participating in PT/OT.

## 2018-04-23 NOTE — Assessment & Plan Note (Signed)
New onset. Now on Eliquis 10 mg po BID for PE treatment/ DVT prophylaxis. Denies chest pain or shortness of breath.

## 2018-04-23 NOTE — Assessment & Plan Note (Addendum)
Severe pain in the Right ankle and right knee after fall at home. No fractures. Acewrap for support. Tramadol 50 mg and Tylenol 650 mg po Q 6 hours prn pain.

## 2018-04-23 NOTE — Progress Notes (Signed)
Location:      Place of Service:  SNF (31) Provider:  Toni Arthurs, NP-C  [name], MD  Patient Care Team: [name], MD as PCP - General (Family Medicine)  Extended Emergency Contact Information Primary Emergency Contact: [name] Address: [place], [place] Phone: [phone] Work Phone: [phone] Relation: Daughter Secondary Emergency Contact: Corlene, Sabia Work Phone: [phone] Mobile Phone: [phone] Relation: Son  Code Status:  FULL Goals of care: Advanced Directive information Advanced Directives 04/18/2018  Does Patient Have a Medical Advance Directive? Yes  Type of Paramedic of [place];Living will  Does patient want to make changes to medical advance directive? -  Copy of [place] in Chart? No - copy requested     Chief Complaint  Patient presents with  . Hospitalization Follow-up    HPI:  Pt is a 84 y.o. female seen today for medical management of chronic diseases.    Pulmonary emboli ([place]) New onset. Now on Eliquis 10 mg po BID for PE treatment/ DVT prophylaxis. Denies chest pain or shortness of breath.   Osteoarthritis involving multiple joints on both sides of body Severe pain in the Right ankle and right knee after fall at home. No fractures. Acewrap for support. Tramadol 50 mg and Tylenol 650 mg po Q 6 hours prn pain.  Weakness Progressive weakness. Had fall at home. Was also found to have UTI in hospital. Participating in PT/OT.    Past Medical History:  Diagnosis Date  . Arrhythmia   . Arthritis   . Breast cancer [facility])    s/p mastectomy  . Cancer ([place])    breast  . Chronic kidney disease    stones  . Dementia   . Diastolic dysfunction    echo 11/07. EF 60-65%. mild to moderate PI  . GERD (gastroesophageal reflux disease)   . HTN (hypertension)   . Hyperlipidemia   . Hypertension   . Memory loss   . Osteoarthritis  (arthritis due to wear and tear of joints)   . Osteoporosis, post-menopausal   . Palpitations    monitor with PACs/PVCs. neg myoview 2007  . Syncope and collapse    Past Surgical History:  Procedure Laterality Date  . hysterectomy-unspecified area    . masectomy     R    No Known Allergies  Allergies as of 04/08/2018   No Known Allergies     Medication List        Accurate as of 04/08/18 11:59 PM. Always use your most recent med list.          acetaminophen 325 MG tablet Commonly known as:  TYLENOL Take 2 tablets (650 mg total) by mouth every 6 (six) hours as needed for mild pain (or Fever >/= 101).   alendronate 70 MG tablet Commonly known as:  FOSAMAX Take 70 mg by mouth every Monday. Take with a full glass of water on an empty stomach.   apixaban 5 MG Tabs tablet Commonly known as:  ELIQUIS Take 2 tablets (10 mg total) by mouth 2 (two) times daily. Take 10 mg 2 times a day for a week followed by 5 mg twice a day   CALCIUM CITRATE + D PO Take 1 tablet by mouth every evening. 636m/500   COMPLETE WOMENS Tabs Take 1 tablet by mouth daily.   donepezil 10 MG tablet Commonly known as:  ARICEPT Take 10 mg  by mouth every evening.   ibuprofen 400 MG tablet Commonly known as:  ADVIL,MOTRIN Take 1 tablet (400 mg total) by mouth every 6 (six) hours as needed for fever, headache, moderate pain or cramping.   memantine 10 MG tablet Commonly known as:  NAMENDA Take 10 mg by mouth 2 (two) times daily.   pravastatin 40 MG tablet Commonly known as:  PRAVACHOL Take 40 mg by mouth every evening.   senna-docusate 8.6-50 MG tablet Commonly known as:  Senokot-S Take 1 tablet by mouth at bedtime as needed for mild constipation.   traMADol 50 MG tablet Commonly known as:  ULTRAM Take 1 tablet (50 mg total) by mouth every 6 (six) hours as needed.       Review of Systems  Constitutional: Negative for activity change, appetite change, chills, diaphoresis and fever.    HENT: Negative for congestion, mouth sores, nosebleeds, postnasal drip, sneezing, sore throat, trouble swallowing and voice change.   Respiratory: Negative for apnea, cough, choking, chest tightness, shortness of breath and wheezing.   Cardiovascular: Negative for chest pain, palpitations and leg swelling.  Gastrointestinal: Negative for abdominal distention, abdominal pain, constipation, diarrhea and nausea.  Genitourinary: Negative for difficulty urinating, dysuria, frequency and urgency.  Musculoskeletal: Positive for arthralgias (typical arthritis), gait problem and myalgias. Negative for back pain.  Skin: Negative for color change, pallor, rash and wound.  Neurological: Positive for weakness. Negative for dizziness, tremors, syncope, speech difficulty, numbness and headaches.  Psychiatric/Behavioral: Positive for confusion. Negative for agitation and behavioral problems.  All other systems reviewed and are negative.   Immunization History  Administered Date(s) Administered  . Influenza,inj,Quad PF,6+ Mos 09/21/2017  . Influenza-Unspecified 11/13/2015  . Pneumococcal Conjugate-13 11/11/2013  . Pneumococcal Polysaccharide-23 04/07/2018   Pertinent  Health Maintenance Due  Topic Date Due  . DEXA SCAN  03/14/1994  . INFLUENZA VACCINE  06/30/2018  . PNA vac Low Risk Adult  Completed   No flowsheet data found. Functional Status Survey:    Vitals:   04/08/18 0800  BP: (!) 165/65  Pulse: 77  Resp: 18  Temp: 98.4 F (36.9 C)  SpO2: 95%   There is no height or weight on file to calculate BMI. Physical Exam  Constitutional: Vital signs are normal. She appears well-developed and well-nourished. She is active and cooperative. She does not appear ill. No distress. Nasal cannula in place.  HENT:  Head: Normocephalic and atraumatic.  Mouth/Throat: Uvula is midline, oropharynx is clear and moist and mucous membranes are normal. Mucous membranes are not pale, not dry and not cyanotic.   Eyes: Pupils are equal, round, and reactive to light. Conjunctivae, EOM and lids are normal.  Neck: Trachea normal, normal range of motion and full passive range of motion without pain. Neck supple. No JVD present. No tracheal deviation, no edema and no erythema present. No thyromegaly present.  Cardiovascular: Normal rate, normal heart sounds, intact distal pulses and normal pulses. An irregular rhythm present. Exam reveals no gallop, no distant heart sounds and no friction rub.  No murmur heard. Pulses:      Dorsalis pedis pulses are 2+ on the right side, and 2+ on the left side.  Mild RLE edema  Pulmonary/Chest: Effort normal and breath sounds normal. No accessory muscle usage. No respiratory distress. She has no decreased breath sounds. She has no wheezes. She has no rhonchi. She has no rales. She exhibits no tenderness.  Abdominal: Soft. Normal appearance and bowel sounds are normal. She exhibits no distension and  no ascites. There is no tenderness.  Musculoskeletal: Normal range of motion. She exhibits no edema.       Right knee: She exhibits swelling (mild). Tenderness found.       Right ankle: She exhibits swelling (mild). Tenderness.  Expected osteoarthritis, stiffness; Bilateral Calves soft, supple. Negative Homan's Sign. B- pedal pulses equal; generalized weakness  Neurological: She is alert. She has normal strength. She exhibits abnormal muscle tone. Gait abnormal.  Skin: Skin is warm, dry and intact. She is not diaphoretic. No cyanosis. No pallor. Nails show no clubbing.  Psychiatric: She has a normal mood and affect. Her speech is normal and behavior is normal. Thought content normal. Cognition and memory are impaired. She expresses impulsivity. She exhibits abnormal recent memory.  Nursing note and vitals reviewed.   Labs reviewed: Recent Labs    04/06/18 0946 04/07/18 0431 04/11/18 0400  NA 136 138 138  K 4.1 4.0 4.2  CL 101 104 103  CO2 27 26 26   GLUCOSE 138* 96 77   BUN 13 13 13   CREATININE 0.98 0.88 0.79  CALCIUM 8.6* 8.2* 8.9   Recent Labs    03/26/18 1533 04/06/18 0946 04/11/18 0400  AST 43* 26 22  ALT 26 19 16   ALKPHOS 64 56 56  BILITOT 0.9 0.9 0.6  PROT 8.0 6.8 6.6  ALBUMIN 3.7 3.1* 3.0*   Recent Labs    03/26/18 1533  04/06/18 0946 04/07/18 0431 04/11/18 0400  WBC 15.6*   < > 18.2* 14.6* 11.4*  NEUTROABS 12.7*  --  15.4*  --  8.4*  HGB 15.1   < > 14.0 12.9 13.3  HCT 45.0   < > 41.3 38.1 39.8  MCV 93.3   < > 92.6 91.8 92.9  PLT 257   < > 292 278 355   < > = values in this interval not displayed.   Lab Results  Component Value Date   TSH 1.374 12/24/2016   No results found for: HGBA1C No results found for: CHOL, HDL, LDLCALC, LDLDIRECT, TRIG, CHOLHDL  Significant Diagnostic Results in last 30 days:  Dg Chest 1 View  Result Date: 03/27/2018 CLINICAL DATA:  Hypoxia. Altered mental status. Pertinent history of breast carcinoma. EXAM: CHEST  1 VIEW COMPARISON:  03/26/2018 FINDINGS: Heart size remains within normal limits. Small left pleural effusion and mild left basilar atelectasis show no significant change. Right lung is clear. Previous right mastectomy and axillary lymph node dissection. IMPRESSION: Stable small left pleural effusion and mild left basilar atelectasis. Electronically Signed   By: Earle Gell M.D.   On: 03/27/2018 09:01   Dg Ankle Complete Right  Result Date: 03/26/2018 CLINICAL DATA:  Status post fall several days ago.  Pain. EXAM: RIGHT ANKLE - COMPLETE 3+ VIEW COMPARISON:  None. FINDINGS: Marked soft tissue swelling in the ankle, lateral greater than medial. Vascular calcifications. No acute fractures are seen. No other acute abnormalities. IMPRESSION: Soft tissue swelling.  No identified fracture. Electronically Signed   By: Dorise Bullion III M.D   On: 03/26/2018 16:34   Ct Angio Chest Pe W And/or Wo Contrast  Result Date: 04/06/2018 CLINICAL DATA:  cardiac arrhythmia, history of EF of 55 to 60%, on last  Doppler in November 2018 presents today with shortness of breath and pleuritic chest discomfort. Patient states it has been there since this morning. She does suffer from dementia most of the history as per EMS. EMS she is on 2 L of nasal cannula but they did found  her with low sats there. They put on a nonrebreather and she came up immediately. EXAM: CT ANGIOGRAPHY CHEST WITH CONTRAST TECHNIQUE: Multidetector CT imaging of the chest was performed using the standard protocol during bolus administration of intravenous contrast. Multiplanar CT image reconstructions and MIPs were obtained to evaluate the vascular anatomy. CONTRAST:  24m ISOVUE-370 IOPAMIDOL (ISOVUE-370) INJECTION 76% COMPARISON:  05/14/1998 by report only FINDINGS: Cardiovascular: Heart size upper limits normal. No pericardial effusion. RV/LV ratio 0.75. Borderline dilatation of central pulmonary arteries. Large central and partially occlusive segmental pulmonary emboli extending into both upper and lower lobes, more extensive right than left. Adequate contrast opacification of the thoracic aorta with no evidence of dissection, aneurysm, or stenosis. There is classic 3-vessel brachiocephalic arch anatomy without proximal stenosis. Minimal scattered atheromatous calcified plaque. Mediastinum/Nodes: No enlarged mediastinal, hilar, or axillary lymph nodes. Thyroid gland, trachea demonstrate no significant findings. Small hiatal hernia. Lungs/Pleura: Trace right pleural effusion. Consolidation/atelectasis posteriorly in the lower lobes left greater than right. Peripheral subsegmental wedge-shaped opacities in the right middle lobe. Marked asymmetric enhancement of the atelectatic portions left lower lobe. No pneumothorax. Upper Abdomen: No acute findings Musculoskeletal: Spondylitic changes at multiple levels in the lower cervical, mid and lower thoracic spine. No fracture or worrisome bone lesion. Advanced DJD in bilateral shoulders. Review of the MIP  images confirms the above findings. IMPRESSION: 1. Bilateral central and segmental pulmonary emboli, right worse than left, without CT evidence of right heart strain. Critical Value/emergent results were discussed by telephone at the time of interpretation on 04/06/2018 at 11:11 am with Dr. JCharlotte Crumb, who verbally acknowledged these results. 2. Trace right pleural effusion. 3.  Aortic Atherosclerosis (ICD10-170.0) 4. Small hiatal hernia Electronically Signed   By: DLucrezia EuropeM.D.   On: 04/06/2018 11:13   Mr Ankle Right Wo Contrast  Result Date: 03/26/2018 CLINICAL DATA:  Right ankle pain after fall on Monday. EXAM: MRI OF THE RIGHT ANKLE WITHOUT CONTRAST TECHNIQUE: Multiplanar, multisequence MR imaging of the ankle was performed. No intravenous contrast was administered. COMPARISON:  None. FINDINGS: TENDONS Peroneal: Intact peroneus longus and peroneus brevis tendons. Posteromedial: Intact tibialis posterior, flexor hallucis longus and flexor digitorum longus tendons. Anterior: Intact tibialis anterior, extensor hallucis longus and extensor digitorum longus tendons. Achilles: Intact. Plantar Fascia: Intact. LIGAMENTS Lateral: Intact Medial: Intact CARTILAGE Ankle Joint: No focal chondral defect. Small posterior joint effusion. Subtalar Joints/Sinus Tarsi: Intact Bones: No marrow edema or fracture. No joint dislocation. Calcaneal enthesopathy along the plantar aspect. Other: Diffuse periarticular soft tissue swelling of the ankle and included foot. IMPRESSION: Soft tissue edema of the ankle and foot. No fracture or malalignment. Intact tendons and ligaments crossing the ankle joint. Electronically Signed   By: DAshley RoyaltyM.D.   On: 03/26/2018 20:40   UKoreaVenous Img Lower Bilateral  Result Date: 04/06/2018 CLINICAL DATA:  Pulmonary embolism, now with bilateral lower extremity pain. History of malignancy. Evaluate for DVT. EXAM: BILATERAL LOWER EXTREMITY VENOUS DOPPLER ULTRASOUND TECHNIQUE: Gray-scale  sonography with graded compression, as well as color Doppler and duplex ultrasound were performed to evaluate the lower extremity deep venous systems from the level of the common femoral vein and including the common femoral, femoral, profunda femoral, popliteal and calf veins including the posterior tibial, peroneal and gastrocnemius veins when visible. The superficial great saphenous vein was also interrogated. Spectral Doppler was utilized to evaluate flow at rest and with distal augmentation maneuvers in the common femoral, femoral and popliteal veins. COMPARISON:  Chest CT - earlier  same day FINDINGS: RIGHT LOWER EXTREMITY Common Femoral Vein: No evidence of thrombus. Normal compressibility, respiratory phasicity and response to augmentation. Saphenofemoral Junction: No evidence of thrombus. Normal compressibility and flow on color Doppler imaging. Profunda Femoral Vein: No evidence of thrombus. Normal compressibility and flow on color Doppler imaging. Femoral Vein: There is hypoechoic near occlusive thrombus within the proximal aspect of the right femoral vein (images 15 and 16). The mid and distal aspects of the right femoral vein appear widely patent. Popliteal Vein: No evidence of thrombus. Normal compressibility, respiratory phasicity and response to augmentation. Calf Veins: No evidence of thrombus. Normal compressibility and flow on color Doppler imaging. Superficial Great Saphenous Vein: No evidence of thrombus. Normal compressibility. Venous Reflux:  None. Other Findings: Note is made of an approximately 5.1 x 3.4 x 1.1 cm minimally complex fluid collection with the right popliteal fossa favored to represent a Baker cyst. LEFT LOWER EXTREMITY Common Femoral Vein: No evidence of thrombus. Normal compressibility, respiratory phasicity and response to augmentation. Saphenofemoral Junction: No evidence of thrombus. Normal compressibility and flow on color Doppler imaging. Profunda Femoral Vein: No evidence  of thrombus. Normal compressibility and flow on color Doppler imaging. Femoral Vein: No evidence of thrombus. Normal compressibility, respiratory phasicity and response to augmentation. Popliteal Vein: No evidence of thrombus. Normal compressibility, respiratory phasicity and response to augmentation. Calf Veins: No evidence of thrombus. Normal compressibility and flow on color Doppler imaging. Superficial Great Saphenous Vein: No evidence of thrombus. Normal compressibility. Venous Reflux:  None. Other Findings:  None. IMPRESSION: 1. The examination is positive for short segment nonocclusive DVT involving the proximal aspect the right superficial femoral vein. 2. No evidence of DVT within left lower extremity. 3. Incidentally noted approximately 5.1 cm minimally complex right-sided Baker's cyst. Electronically Signed   By: Sandi Mariscal M.D.   On: 04/06/2018 16:34   US Venous Img Lower Unilateral Right  Result Date: 03/26/2018 CLINICAL DATA:  Fall.  Edema. EXAM: RIGHT LOWER EXTREMITY VENOUS DOPPLER ULTRASOUND TECHNIQUE: Gray-scale sonography with graded compression, as well as color Doppler and duplex ultrasound were performed to evaluate the lower extremity deep venous systems from the level of the common femoral vein and including the common femoral, femoral, profunda femoral, popliteal and calf veins including the posterior tibial, peroneal and gastrocnemius veins when visible. The superficial great saphenous vein was also interrogated. Spectral Doppler was utilized to evaluate flow at rest and with distal augmentation maneuvers in the common femoral, femoral and popliteal veins. COMPARISON:  None. FINDINGS: Contralateral Common Femoral Vein: Respiratory phasicity is normal and symmetric with the symptomatic side. No evidence of thrombus. Normal compressibility. Common Femoral Vein: No evidence of thrombus. Normal compressibility, respiratory phasicity and response to augmentation. Saphenofemoral Junction: No  evidence of thrombus. Normal compressibility and flow on color Doppler imaging. Profunda Femoral Vein: No evidence of thrombus. Normal compressibility and flow on color Doppler imaging. Femoral Vein: No evidence of thrombus. Normal compressibility, respiratory phasicity and response to augmentation. Popliteal Vein: No evidence of thrombus. Normal compressibility, respiratory phasicity and response to augmentation. Calf Veins: No evidence of thrombus. Normal compressibility and flow on color Doppler imaging. Superficial Great Saphenous Vein: No evidence of thrombus. Normal compressibility. Venous Reflux:  None. Other Findings: There is a Engineer, agricultural cyst measuring 6.2 x 2.1 x 4.0 cm. IMPRESSION: 6.2 x 2.1 x 4.0 cm right popliteal Baker's cyst.  No DVT. Electronically Signed   By: Dorise Bullion III M.D   On: 03/26/2018 16:37   Dg Chest [facility]  1 View  Result Date: 04/06/2018 CLINICAL DATA:  84 year old female with cardiac arrhythmia. Shortness of breath. Pleuritic chest discomfort. Initial encounter. EXAM: PORTABLE CHEST 1 VIEW COMPARISON:  03/27/2018. FINDINGS: Left-sided pleural effusion.  Left base atelectasis or infiltrate. Minimal pleural based opacity peripheral aspect right lower lobe may represent minimal atelectasis (No well-defined consolidation as seen with Hampton hump). Central pulmonary vascular prominence without pulmonary edema. Cardiomegaly. Calcified tortuous aorta. Bilateral shoulder joint degenerative changes. IMPRESSION: Left-sided pleural effusion. Left base atelectasis or infiltrate. Cardiomegaly. Aortic Atherosclerosis (ICD10-I70.0). Electronically Signed   By: Genia Del M.D.   On: 04/06/2018 10:26   Dg Chest Port 1 View  Result Date: 03/26/2018 CLINICAL DATA:  Status post fall.  Pain. EXAM: PORTABLE CHEST 1 VIEW COMPARISON:  December 23, 2016 FINDINGS: Small left effusion with atelectasis. The heart, hila, mediastinum, lungs, and pleura are otherwise normal. Degenerative changes in  the shoulders. IMPRESSION: Small left effusion with underlying opacity, likely atelectasis. No other acute abnormality. Electronically Signed   By: Dorise Bullion III M.D   On: 03/26/2018 16:35   Dg Knee Complete 4 Views Right  Result Date: 03/26/2018 CLINICAL DATA:  Pain after fall EXAM: RIGHT KNEE - COMPLETE 4+ VIEW COMPARISON:  None. FINDINGS: Severe tricompartmental degenerative changes. No acute fractures are seen. No joint effusion noted. Chondrocalcinosis. IMPRESSION: Chondrocalcinosis consistent with CPPD. Severe degenerative changes. No fracture or effusion. Electronically Signed   By: Dorise Bullion III M.D   On: 03/26/2018 16:35    Assessment/Plan Erin Shannon was seen today for hospitalization follow-up.  Diagnoses and all orders for this visit:  Acute pulmonary embolism without acute cor pulmonale, unspecified pulmonary embolism type (HCC)  Osteoarthritis involving multiple joints on both sides of body  Weakness   Continue PT/OT  Continue exercises as taught by PT/OT  Continue Tramadol 50 mg po Q 6 hours prn pain  Continue Tylenol 650 mg po Q 6 hours prn pain  Ice pack prn for pain  Elevate leg when at rest  Safety precautions  Fall precautions  Continue Eliquis 10 mg po BID   Monitor for abnormal bleeding  Follow up with Orthopedist, etc as instructed  Family/ staff Communication:   Total Time:  Documentation:  Face to Face:  Family/Phone:   Labs/tests ordered:  CBC, Met C  Medication list reviewed and assessed for continued appropriateness. Monthly medication orders reviewed and signed.  Erin Ports, NP-C Geriatrics [facility] [phone], [place] [address] Cell Phone (Mon-Fri 8am-5pm):  [phone] On Call:  [phone] & follow prompts after 5pm & weekends Office Phone:  [phone] Office Fax:  [phone]

## 2018-04-30 ENCOUNTER — Encounter
Admission: RE | Admit: 2018-04-30 | Discharge: 2018-04-30 | Disposition: A | Discharge status: Home / Self Care | Payer: Medicare Other | Source: Ambulatory Visit | Attending: Internal Medicine | Admitting: Internal Medicine

## 2018-05-03 ENCOUNTER — Emergency Department: Payer: Medicare Other

## 2018-05-03 ENCOUNTER — Inpatient Hospital Stay
Admission: EM | Admit: 2018-05-03 | Discharge: 2018-05-05 | DRG: 071 | Disposition: A | Discharge status: Skilled Nursing Facility | Payer: Medicare Other | Source: Skilled Nursing Facility | Attending: Internal Medicine | Admitting: Internal Medicine

## 2018-05-03 ENCOUNTER — Other Ambulatory Visit: Payer: Self-pay

## 2018-05-03 DIAGNOSIS — Z86718 Personal history of other venous thrombosis and embolism: Secondary | ICD-10-CM | POA: Diagnosis not present

## 2018-05-03 DIAGNOSIS — I129 Hypertensive chronic kidney disease with stage 1 through stage 4 chronic kidney disease, or unspecified chronic kidney disease: Secondary | ICD-10-CM | POA: Diagnosis present

## 2018-05-03 DIAGNOSIS — N390 Urinary tract infection, site not specified: Secondary | ICD-10-CM | POA: Diagnosis present

## 2018-05-03 DIAGNOSIS — F0280 Dementia in other diseases classified elsewhere without behavioral disturbance: Secondary | ICD-10-CM | POA: Diagnosis present

## 2018-05-03 DIAGNOSIS — Z79899 Other long term (current) drug therapy: Secondary | ICD-10-CM

## 2018-05-03 DIAGNOSIS — M1990 Unspecified osteoarthritis, unspecified site: Secondary | ICD-10-CM | POA: Diagnosis present

## 2018-05-03 DIAGNOSIS — K219 Gastro-esophageal reflux disease without esophagitis: Secondary | ICD-10-CM | POA: Diagnosis present

## 2018-05-03 DIAGNOSIS — M810 Age-related osteoporosis without current pathological fracture: Secondary | ICD-10-CM | POA: Diagnosis present

## 2018-05-03 DIAGNOSIS — Z7901 Long term (current) use of anticoagulants: Secondary | ICD-10-CM | POA: Diagnosis not present

## 2018-05-03 DIAGNOSIS — G9341 Metabolic encephalopathy: Secondary | ICD-10-CM | POA: Diagnosis not present

## 2018-05-03 DIAGNOSIS — E785 Hyperlipidemia, unspecified: Secondary | ICD-10-CM | POA: Diagnosis present

## 2018-05-03 DIAGNOSIS — Z8249 Family history of ischemic heart disease and other diseases of the circulatory system: Secondary | ICD-10-CM

## 2018-05-03 DIAGNOSIS — Z9010 Acquired absence of unspecified breast and nipple: Secondary | ICD-10-CM

## 2018-05-03 DIAGNOSIS — I4891 Unspecified atrial fibrillation: Secondary | ICD-10-CM | POA: Diagnosis present

## 2018-05-03 DIAGNOSIS — I130 Hypertensive heart and chronic kidney disease with heart failure and stage 1 through stage 4 chronic kidney disease, or unspecified chronic kidney disease: Secondary | ICD-10-CM | POA: Diagnosis present

## 2018-05-03 DIAGNOSIS — Z86711 Personal history of pulmonary embolism: Secondary | ICD-10-CM | POA: Diagnosis not present

## 2018-05-03 DIAGNOSIS — Z90710 Acquired absence of both cervix and uterus: Secondary | ICD-10-CM | POA: Diagnosis not present

## 2018-05-03 DIAGNOSIS — Z7983 Long term (current) use of bisphosphonates: Secondary | ICD-10-CM | POA: Diagnosis not present

## 2018-05-03 DIAGNOSIS — Z853 Personal history of malignant neoplasm of breast: Secondary | ICD-10-CM | POA: Diagnosis not present

## 2018-05-03 DIAGNOSIS — N189 Chronic kidney disease, unspecified: Secondary | ICD-10-CM | POA: Diagnosis present

## 2018-05-03 DIAGNOSIS — Z833 Family history of diabetes mellitus: Secondary | ICD-10-CM | POA: Diagnosis not present

## 2018-05-03 DIAGNOSIS — I5032 Chronic diastolic (congestive) heart failure: Secondary | ICD-10-CM | POA: Diagnosis present

## 2018-05-03 DIAGNOSIS — E860 Dehydration: Secondary | ICD-10-CM | POA: Diagnosis present

## 2018-05-03 DIAGNOSIS — Z803 Family history of malignant neoplasm of breast: Secondary | ICD-10-CM | POA: Diagnosis not present

## 2018-05-03 DIAGNOSIS — G9340 Encephalopathy, unspecified: Secondary | ICD-10-CM | POA: Diagnosis present

## 2018-05-03 LAB — AMMONIA: Ammonia: 22 umol/L (ref 9–35)

## 2018-05-03 LAB — COMPREHENSIVE METABOLIC PANEL
ALBUMIN: 3.5 g/dL (ref 3.5–5.0)
ALK PHOS: 54 U/L (ref 38–126)
ALT: 15 U/L (ref 14–54)
AST: 25 U/L (ref 15–41)
Anion gap: 10 (ref 5–15)
BILIRUBIN TOTAL: 1.2 mg/dL (ref 0.3–1.2)
BUN: 13 mg/dL (ref 6–20)
CO2: 25 mmol/L (ref 22–32)
Calcium: 9 mg/dL (ref 8.9–10.3)
Chloride: 101 mmol/L (ref 101–111)
Creatinine, Ser: 1.24 mg/dL — ABNORMAL HIGH (ref 0.44–1.00)
GFR calc Af Amer: 43 mL/min — ABNORMAL LOW (ref >60)
GFR calc non Af Amer: 37 mL/min — ABNORMAL LOW (ref >60)
GLUCOSE: 167 mg/dL — AB (ref 65–99)
POTASSIUM: 4.4 mmol/L (ref 3.5–5.1)
Sodium: 136 mmol/L (ref 135–145)
TOTAL PROTEIN: 6.6 g/dL (ref 6.5–8.1)

## 2018-05-03 LAB — URINALYSIS, COMPLETE (UACMP) WITH MICROSCOPIC
Glucose, UA: NEGATIVE mg/dL
HGB URINE DIPSTICK: NEGATIVE
KETONES UR: 5 mg/dL — AB
Nitrite: NEGATIVE
PROTEIN: 100 mg/dL — AB
Specific Gravity, Urine: 1.023 (ref 1.005–1.030)
WBC, UA: >50 WBC/hpf — ABNORMAL HIGH (ref 0–5)
pH: 5 (ref 5.0–8.0)

## 2018-05-03 LAB — CBC
HEMATOCRIT: 40.9 % (ref 35.0–47.0)
HEMOGLOBIN: 13.4 g/dL (ref 12.0–16.0)
MCH: 29.9 pg (ref 26.0–34.0)
MCHC: 32.9 g/dL (ref 32.0–36.0)
MCV: 90.9 fL (ref 80.0–100.0)
Platelets: 299 10*3/uL (ref 150–440)
RBC: 4.49 MIL/uL (ref 3.80–5.20)
RDW: 14 % (ref 11.5–14.5)
WBC: 18.1 10*3/uL — AB (ref 3.6–11.0)

## 2018-05-03 LAB — TROPONIN I

## 2018-05-03 LAB — TSH: TSH: 1.439 u[IU]/mL (ref 0.350–4.500)

## 2018-05-03 MED ORDER — PRAVASTATIN SODIUM 40 MG PO TABS
40.0000 mg | ORAL_TABLET | Freq: Every evening | ORAL | Status: DC
  Administered 2018-05-03 – 2018-05-04 (×2): 40 mg via ORAL
  Filled 2018-05-03: qty 1
  Filled 2018-05-03: qty 2
  Filled 2018-05-03 (×2): qty 1

## 2018-05-03 MED ORDER — ADULT MULTIVITAMIN W/MINERALS CH
1.0000 | ORAL_TABLET | Freq: Every day | ORAL | Status: DC
  Administered 2018-05-04 – 2018-05-05 (×2): 1 via ORAL
  Filled 2018-05-03 (×2): qty 1

## 2018-05-03 MED ORDER — ONDANSETRON HCL 4 MG/2ML IJ SOLN: 4.0000 mg | Freq: Four times a day (QID) | INTRAMUSCULAR | Status: DC | PRN

## 2018-05-03 MED ORDER — DILTIAZEM HCL ER COATED BEADS 240 MG PO CP24
240.0000 mg | ORAL_CAPSULE | Freq: Every day | ORAL | Status: DC
  Administered 2018-05-04 – 2018-05-05 (×2): 240 mg via ORAL
  Filled 2018-05-03 (×2): qty 1

## 2018-05-03 MED ORDER — CALCIUM CARBONATE-VITAMIN D 500-200 MG-UNIT PO TABS
1.0000 | ORAL_TABLET | Freq: Every day | ORAL | Status: DC
  Administered 2018-05-04 – 2018-05-05 (×2): 1 via ORAL
  Filled 2018-05-03 (×2): qty 1

## 2018-05-03 MED ORDER — SODIUM CHLORIDE 0.9 % IV SOLN
1000.0000 mL | Freq: Once | INTRAVENOUS | Status: AC
  Administered 2018-05-03: 1000 mL via INTRAVENOUS

## 2018-05-03 MED ORDER — LACTATED RINGERS IV SOLN
INTRAVENOUS | Status: DC
  Administered 2018-05-03: 17:00:00 via INTRAVENOUS

## 2018-05-03 MED ORDER — FAMOTIDINE IN NACL 20-0.9 MG/50ML-% IV SOLN
20.0000 mg | INTRAVENOUS | Status: DC
  Administered 2018-05-03 – 2018-05-04 (×2): 20 mg via INTRAVENOUS
  Filled 2018-05-03 (×2): qty 50

## 2018-05-03 MED ORDER — ALENDRONATE SODIUM 70 MG PO TABS: 70.0000 mg | ORAL_TABLET | ORAL | Status: DC

## 2018-05-03 MED ORDER — APIXABAN 5 MG PO TABS
5.0000 mg | ORAL_TABLET | Freq: Two times a day (BID) | ORAL | Status: DC
  Administered 2018-05-03 – 2018-05-05 (×4): 5 mg via ORAL
  Filled 2018-05-03 (×4): qty 1

## 2018-05-03 MED ORDER — SODIUM CHLORIDE 0.9 % IV SOLN
1.0000 g | INTRAVENOUS | Status: DC
  Administered 2018-05-04: 16:00:00 1 g via INTRAVENOUS
  Filled 2018-05-03: qty 1
  Filled 2018-05-03: qty 10

## 2018-05-03 MED ORDER — ONDANSETRON HCL 4 MG PO TABS: 4.0000 mg | ORAL_TABLET | Freq: Four times a day (QID) | ORAL | Status: DC | PRN

## 2018-05-03 MED ORDER — SENNOSIDES-DOCUSATE SODIUM 8.6-50 MG PO TABS: 1.0000 | ORAL_TABLET | Freq: Every evening | ORAL | Status: DC | PRN

## 2018-05-03 MED ORDER — SODIUM CHLORIDE 0.9 % IV SOLN
1.0000 g | Freq: Once | INTRAVENOUS | Status: AC
  Administered 2018-05-03: 1 g via INTRAVENOUS
  Filled 2018-05-03: qty 10

## 2018-05-03 MED ORDER — ACETAMINOPHEN 650 MG RE SUPP: 650.0000 mg | Freq: Four times a day (QID) | RECTAL | Status: DC | PRN

## 2018-05-03 MED ORDER — ACETAMINOPHEN 325 MG PO TABS
650.0000 mg | ORAL_TABLET | Freq: Four times a day (QID) | ORAL | Status: DC | PRN
  Administered 2018-05-04: 13:00:00 650 mg via ORAL
  Filled 2018-05-03: qty 2

## 2018-05-03 NOTE — ED Triage Notes (Signed)
Pt arrives via ems from [facility] place. Ems reports pt went to [facility] few weeks ago for rehab due to weakness. Pt scheduled to go to long term care today but facility states pt just didn't seem her usual spunky self. On arrival NAD noted at this time. Pt denies any pain, numbness or tingling. Brace in place on right leg, no surgery or breaks reported by facility for right leg.

## 2018-05-03 NOTE — ED Notes (Signed)
Patient transported to CT 

## 2018-05-03 NOTE — ED Provider Notes (Signed)
[facility] Emergency Department Provider Note   ____________________________________________    I have reviewed the triage vital signs and the nursing notes.   HISTORY  Chief Complaint Weakness   History limited by dementia  HPI Erin Shannon is a 84 y.o. female with a history of dementia who presents from [facility] where she has been for rehab.  Apparently today patient had a change in mental status.  She has not been communicating and has been fatigued.  Moving all extremities equally.  No reports of fever.  Has not been given any new medications today.  Past Medical History:  Diagnosis Date  . Arrhythmia   . Arthritis   . Breast cancer [facility])    s/p mastectomy  . Cancer ([place])    breast  . Chronic kidney disease    stones  . Dementia   . Diastolic dysfunction    echo 11/07. EF 60-65%. mild to moderate PI  . GERD (gastroesophageal reflux disease)   . HTN (hypertension)   . Hyperlipidemia   . Hypertension   . Memory loss   . Osteoarthritis (arthritis due to wear and tear of joints)   . Osteoporosis, post-menopausal   . Palpitations    monitor with PACs/PVCs. neg myoview 2007  . Syncope and collapse     Patient Active Problem List   Diagnosis Date Noted  . Osteoarthritis involving multiple joints on both sides of body 04/23/2018  . Pulmonary emboli ([place]) 04/06/2018  . Acute lower UTI 03/26/2018  . UTI (urinary tract infection) 12/24/2016  . Mobitz (type) I (Wenckebach's) atrioventricular block 12/24/2016  . Demand ischemia ([place]) 12/24/2016  . Weakness 12/24/2016  . ARF (acute renal failure) ([place]) 12/23/2016  . Chronic venous insufficiency 07/27/2014  . BRADYCARDIA 12/19/2010  . DIASTOLIC HEART FAILURE, CHRONIC 12/19/2010  . HYPERTENSION, BENIGN 02/06/2009  . PALPITATIONS 02/06/2009    Past Surgical History:  Procedure Laterality Date  . hysterectomy-unspecified area    . masectomy     R    Prior to Admission  medications   Medication Sig Start Date End Date Taking? Authorizing Provider  acetaminophen (TYLENOL) 325 MG tablet Take 2 tablets (650 mg total) by mouth every 6 (six) hours as needed for mild pain (or Fever >/= 101). 04/07/18  Yes Gouru, Illene Silver, MD  alendronate (FOSAMAX) 70 MG tablet Take 70 mg by mouth every Monday. Take with a full glass of water on an empty stomach.    Yes [provider]  apixaban (ELIQUIS) 5 MG TABS tablet Take 5 mg by mouth 2 (two) times daily.   Yes [provider]  calcium citrate-vitamin D (CITRACAL+D) 315-200 MG-UNIT tablet Take 1 tablet by mouth daily.   Yes [provider]  diltiazem (DILACOR XR) 240 MG 24 hr capsule Take 240 mg by mouth daily.   Yes [provider]  donepezil (ARICEPT) 10 MG tablet Take 10 mg by mouth every evening.    Yes [provider]  memantine (NAMENDA) 10 MG tablet Take 10 mg by mouth 2 (two) times daily.     Yes [provider]  Multiple Vitamins-Minerals (COMPLETE WOMENS) TABS Take 1 tablet by mouth daily.     Yes [provider]  pravastatin (PRAVACHOL) 40 MG tablet Take 40 mg by mouth every evening.    Yes [provider]  senna-docusate (SENOKOT-S) 8.6-50 MG tablet Take 1 tablet by mouth at bedtime as needed for mild constipation. 04/07/18  Yes Nicholes Mango, MD  traMADol (ULTRAM) 50 MG tablet Take 1 tablet (50 mg total) by mouth every 6 (six) hours as needed. 04/07/18 04/07/19 Yes Gouru, Illene Silver, MD  traZODone (DESYREL) 50 MG tablet Take 50 mg by mouth at bedtime as needed for sleep.   Yes [provider]     Allergies Patient has no known allergies.  Family History  Problem Relation Age of Onset  . Breast cancer Mother   . Diabetes type II Mother   . Hypertension Mother   . Heart failure Father   . Coronary artery disease Father   . Heart disease Brother     Social History Social History   Tobacco Use  . Smoking status: Never Smoker  . Smokeless  tobacco: Never Used  . Tobacco comment: tobacco use -no  Substance Use Topics  . Alcohol use: No  . Drug use: No    Review of Systems to by dementia  Constitutional: No reports of fever Eyes: No discharge ENT: No sore throat. Cardiovascular: Denies pain in the chest Respiratory: No reports of cough Gastrointestinal: Ports of vomiting or diarrhea Genitourinary: Positive report of foul-smelling urine Musculoskeletal: Negative for joint swelling Skin: Negative for rash. Neurological: Negative for focal weakness   ____________________________________________   PHYSICAL EXAM:  VITAL SIGNS: ED Triage Vitals  Enc Vitals Group     BP 05/03/18 1323 (!) 112/56     Pulse Rate 05/03/18 1323 64     Resp --      Temp 05/03/18 1323 98.2 F (36.8 C)     Temp Source 05/03/18 1323 Oral     SpO2 05/03/18 1240 96 %     Weight 05/03/18 1246 59.9 kg (132 lb)     Height 05/03/18 1246 1.499 m (4\' 11" )     Head Circumference --      Peak Flow --      Pain Score 05/03/18 1246 0     Pain Loc --      Pain Edu? --      Excl. in [place]? --     Constitutional: Alert but disoriented  Head: Atraumatic. Nose: No congestion/rhinnorhea. Mouth/Throat: Mucous membranes are dry Neck:  Painless ROM Cardiovascular: Normal rate, regular rhythm. Grossly normal heart sounds.  Good peripheral circulation. Respiratory: Normal respiratory effort.  No retractions. Lungs CTAB. Gastrointestinal: Soft and nontender. No distention.  No CVA tenderness.  Musculoskeletal: No significant swelling Neurologic:  Normal speech and language. No gross focal neurologic deficits are appreciated.  Skin:  Skin is warm, dry and intact. No rash noted. Psychiatric: Mood and affect are normal. Speech and behavior are normal.  ____________________________________________   LABS (all labs ordered are listed, but only abnormal results are displayed)  Labs Reviewed  CBC - Abnormal; Notable for the following components:       Result Value   WBC 18.1 (*)    All other components within normal limits  COMPREHENSIVE METABOLIC PANEL - Abnormal; Notable for the following components:   Glucose, Bld 167 (*)    Creatinine, Ser 1.24 (*)    GFR calc non Af Amer 37 (*)    GFR calc Af Amer 43 (*)    All other components within normal limits  URINALYSIS, COMPLETE (UACMP) WITH MICROSCOPIC - Abnormal; Notable for the following components:   Color, Urine AMBER (*)    APPearance CLOUDY (*)    Bilirubin Urine SMALL (*)    Ketones, ur 5 (*)    Protein, ur 100 (*)    Leukocytes, UA SMALL (*)  WBC, UA >50 (*)    Bacteria, UA MANY (*)    All other components within normal limits  URINE CULTURE   ____________________________________________  EKG  None ____________________________________________  RADIOLOGY  Chest x-ray unremarkable with no pneumonia ____________________________________________   PROCEDURES  Procedure(s) performed: No  Procedures   Critical Care performed: No ____________________________________________   INITIAL IMPRESSION / ASSESSMENT AND PLAN / ED COURSE  Pertinent labs & imaging results that were available during my care of the patient were reviewed by me and considered in my medical decision making (see chart for details).  Patient presents with change in mental status.  Urinalysis consistent with UTI, strongly suspect metabolic encephalopathy given elevated white blood cell count.  Treated with IV Rocephin, urine culture sent.  CT head overall unremarkable  Discussed with the hospitalist service for admission ____________________________________________   FINAL CLINICAL IMPRESSION(S) / ED DIAGNOSES  Final diagnoses:  Urinary tract infection without hematuria, site unspecified  Acute metabolic encephalopathy        Note:  This document was prepared using Dragon voice recognition software and may include unintentional dictation errors.    Lavonia Drafts, MD 05/03/18  1452

## 2018-05-03 NOTE — H&P (Addendum)
[facility] NAME: Erin Shannon    MR#:  [id number]  DATE OF BIRTH:  Jan 10, 1927  DATE OF ADMISSION:  05/03/2018  PRIMARY CARE PHYSICIAN: [name], MD   REQUESTING/REFERRING PHYSICIAN:   CHIEF COMPLAINT:   Chief Complaint  Patient presents with  . Weakness    HISTORY OF PRESENT ILLNESS: Erin Shannon  is a 84 y.o. female with a known history per below presented to the emergency room from inpatient rehab replacing was residing for extremity weakness, was due to be transferred to long-term care facility today, patient sent to the emergency room for generalized weakness/fatigue, patient quite not herself, ?  Altered mental status, patient with known dementia, in the emergency room patient was found to have a white count 18,000, creatinine 1.2, urinalysis noted for ketones and possibly consistent with infection, chest x-ray was negative, patient is poor historian due to dementia, patient is now being admitted for acute encephalopathy with known dementia secondary to acute urinary tract infection and dehydration.  PAST MEDICAL HISTORY:   Past Medical History:  Diagnosis Date  . Arrhythmia   . Arthritis   . Breast cancer [facility])    s/p mastectomy  . Cancer ([place])    breast  . Chronic kidney disease    stones  . Dementia   . Diastolic dysfunction    echo 11/07. EF 60-65%. mild to moderate PI  . GERD (gastroesophageal reflux disease)   . HTN (hypertension)   . Hyperlipidemia   . Hypertension   . Memory loss   . Osteoarthritis (arthritis due to wear and tear of joints)   . Osteoporosis, post-menopausal   . Palpitations    monitor with PACs/PVCs. neg myoview 2007  . Syncope and collapse     PAST SURGICAL HISTORY:  Past Surgical History:  Procedure Laterality Date  . hysterectomy-unspecified area    . masectomy     R    SOCIAL HISTORY:  Social History   Tobacco Use  . Smoking status: Never Smoker  . Smokeless tobacco:  Never Used  . Tobacco comment: tobacco use -no  Substance Use Topics  . Alcohol use: No    FAMILY HISTORY:  Family History  Problem Relation Age of Onset  . Breast cancer Mother   . Diabetes type II Mother   . Hypertension Mother   . Heart failure Father   . Coronary artery disease Father   . Heart disease Brother     DRUG ALLERGIES: No Known Allergies  REVIEW OF SYSTEMS: Poor historian due to dementia/encephalopathy, no family available  CONSTITUTIONAL: No fever, +fatigue, weakness.  EYES: No blurred or double vision.  EARS, NOSE, AND THROAT: No tinnitus or ear pain.  RESPIRATORY: No cough, shortness of breath, wheezing or hemoptysis.  CARDIOVASCULAR: No chest pain, orthopnea, edema.  GASTROINTESTINAL: No nausea, vomiting, diarrhea or abdominal pain.  GENITOURINARY: No dysuria, hematuria.  ENDOCRINE: No polyuria, nocturia,  HEMATOLOGY: No anemia, easy bruising or bleeding SKIN: No rash or lesion. MUSCULOSKELETAL: Right knee pain    NEUROLOGIC: No tingling, numbness, weakness.  PSYCHIATRY: No anxiety or depression.   MEDICATIONS AT HOME:  Prior to Admission medications   Medication Sig Start Date End Date Taking? Authorizing Provider  acetaminophen (TYLENOL) 325 MG tablet Take 2 tablets (650 mg total) by mouth every 6 (six) hours as needed for mild pain (or Fever >/= 101). 04/07/18  Yes Gouru, Illene Silver, MD  alendronate (FOSAMAX) 70 MG tablet Take 70 mg by mouth  every Monday. Take with a full glass of water on an empty stomach.    Yes [provider]  apixaban (ELIQUIS) 5 MG TABS tablet Take 5 mg by mouth 2 (two) times daily.   Yes [provider]  calcium citrate-vitamin D (CITRACAL+D) 315-200 MG-UNIT tablet Take 1 tablet by mouth daily.   Yes [provider]  diltiazem (DILACOR XR) 240 MG 24 hr capsule Take 240 mg by mouth daily.   Yes [provider]  donepezil (ARICEPT) 10 MG tablet Take 10 mg by mouth every evening.    Yes [provider]  memantine (NAMENDA) 10 MG tablet Take 10 mg by mouth 2 (two) times daily.     Yes [provider]  Multiple Vitamins-Minerals (COMPLETE WOMENS) TABS Take 1 tablet by mouth daily.     Yes [provider]  pravastatin (PRAVACHOL) 40 MG tablet Take 40 mg by mouth every evening.    Yes [provider]  senna-docusate (SENOKOT-S) 8.6-50 MG tablet Take 1 tablet by mouth at bedtime as needed for mild constipation. 04/07/18  Yes Gouru, Illene Silver, MD  traZODone (DESYREL) 50 MG tablet Take 50 mg by mouth at bedtime as needed for sleep.   Yes [provider]      PHYSICAL EXAMINATION:   VITAL SIGNS: Blood pressure (!) 112/56, pulse 64, temperature 98.2 F (36.8 C), temperature source Oral, height 4\' 11"  (1.499 m), weight 59.9 kg (132 lb), SpO2 93 %.  GENERAL:  84 y.o.-year-old patient lying in the bed with no acute distress.  Erin Shannon is EYES: Pupils equal, round, reactive to light and accommodation. No scleral icterus. Extraocular muscles intact.  HEENT: Head atraumatic, normocephalic. Oropharynx and nasopharynx clear.  NECK:  Supple, no jugular venous distention. No thyroid enlargement, no tenderness.  LUNGS: Normal breath sounds bilaterally, no wheezing, rales,rhonchi or crepitation. No use of accessory muscles of respiration.  CARDIOVASCULAR: S1, S2 normal. No murmurs, rubs, or gallops.  ABDOMEN: Soft, nontender, nondistended. Bowel sounds present. No organomegaly or mass.  EXTREMITIES: No pedal edema, cyanosis, or clubbing.  Right lower extremity with immobilizer in place NEUROLOGIC: Cranial nerves II through XII are intact. MAES. Gait not checked.  PSYCHIATRIC: The patient is alert and alert, confused/disoriented  SKIN: No obvious rash, lesion, or ulcer.   LABORATORY PANEL:   CBC Recent Labs  Lab 05/03/18 1312  WBC 18.1*  HGB 13.4  HCT 40.9  PLT 299  MCV 90.9  MCH 29.9  MCHC 32.9  RDW 14.0    ------------------------------------------------------------------------------------------------------------------  Chemistries  Recent Labs  Lab 05/03/18 1312  NA 136  K 4.4  CL 101  CO2 25  GLUCOSE 167*  BUN 13  CREATININE 1.24*  CALCIUM 9.0  AST 25  ALT 15  ALKPHOS 54  BILITOT 1.2   ------------------------------------------------------------------------------------------------------------------ estimated creatinine clearance is 24.2 mL/min (A) (by C-G formula based on SCr of 1.24 mg/dL (H)). ------------------------------------------------------------------------------------------------------------------ No results for input(s): TSH, T4TOTAL, T3FREE, THYROIDAB in the last 72 hours.  Invalid input(s): FREET3   Coagulation profile No results for input(s): INR, PROTIME in the last 168 hours. ------------------------------------------------------------------------------------------------------------------- No results for input(s): DDIMER in the last 72 hours. -------------------------------------------------------------------------------------------------------------------  Cardiac Enzymes No results for input(s): CKMB, TROPONINI, MYOGLOBIN in the last 168 hours.  Invalid input(s): CK ------------------------------------------------------------------------------------------------------------------ Invalid input(s): POCBNP  ---------------------------------------------------------------------------------------------------------------  Urinalysis    Component Value Date/Time   COLORURINE AMBER (A) 05/03/2018 1301   APPEARANCEUR CLOUDY (A) 05/03/2018 1301   LABSPEC 1.023 05/03/2018 1301   PHURINE 5.0 05/03/2018  [place] 05/03/2018 1301   HGBUR NEGATIVE 05/03/2018 1301   BILIRUBINUR SMALL (A) 05/03/2018 1301   KETONESUR 5 (A) 05/03/2018 1301   PROTEINUR 100 (A) 05/03/2018 1301   NITRITE NEGATIVE 05/03/2018 1301   LEUKOCYTESUR SMALL (A) 05/03/2018  1301     RADIOLOGY: Dg Chest 2 View  Result Date: 05/03/2018 CLINICAL DATA:  This.  History of breast carcinoma.  Hypertension. EXAM: CHEST - 2 VIEW COMPARISON:  Chest radiograph and chest CT Apr 06, 2018 FINDINGS: There is a small left pleural effusion. There is an area of scarring in the lateral right base. There is no edema or consolidation. Heart size is upper normal with pulmonary vascularity normal. No adenopathy evident. Bones are osteoporotic. Bones are osteoporotic with advanced arthropathy in the shoulders bilaterally. There is degenerative change throughout the thoracic spine as well. There is anterior wedging of the T12 vertebral body, stable. IMPRESSION: Small left pleural effusion. Mild scarring lateral right base. No edema or consolidation. Stable cardiac silhouette. No adenopathy. Advanced arthropathy noted in both shoulders. Bones are osteoporotic. Electronically Signed   By: Lowella Grip III M.D.   On: 05/03/2018 14:08   Ct Head Wo Contrast  Result Date: 05/03/2018 CLINICAL DATA:  84 year old female with altered mental status. Initial encounter. EXAM: CT HEAD WITHOUT CONTRAST TECHNIQUE: Contiguous axial images were obtained from the base of the skull through the vertex without intravenous contrast. COMPARISON:  12/23/2016 CT. FINDINGS: Brain: No intracranial hemorrhage or CT evidence of large acute infarct. Prominent chronic microvascular changes. Global atrophy. Ventricular prominence unchanged and felt to be related to atrophy rather than hydrocephalus. No intracranial mass lesion noted on this unenhanced exam. Vascular: Vascular calcifications. Skull: Negative Sinuses/Orbits: No acute orbital abnormality. Visualized paranasal sinuses are clear. Other: Mastoid air cells and middle ear cavities are clear. IMPRESSION: No acute intracranial abnormality. Prominent chronic microvascular changes. Atrophy. Electronically Signed   By: Genia Del M.D.   On: 05/03/2018 14:49     EKG: Orders placed or performed during the hospital encounter of 04/06/18  . ED EKG  . ED EKG  . EKG 12-Lead  . EKG 12-Lead  . EKG    IMPRESSION AND PLAN: *Acute encephalopathy Noted history of dementia Most likely exacerbated by acute UTI and dehydration Admit to regular nursing floor bed, check ammonia level, RPR, neurochecks per routine, fall precautions, and continue close medical monitoring  *Acute urinary tract infection IV Rocephin, follow-up on cultures  *Acute dehydration IV fluids rehydration  *Chronic dementia Increase nursing care PRN, aspiration/fall precautions while in house  *History of PE/DVT Stable Continue Eliquis  *Chronic GERD without esophagitis Stable PPI daily  *Right lower extremity pain  Patient with right knee immobilizer in place  etiology unknown  Continue knee immobilizer for now   All the records are reviewed and case discussed with ED provider. Management plans discussed with the patient, family and they are in agreement.  CODE STATUS:full code Code Status History    Date Active Date Inactive Code Status Order ID Comments User Context   03/26/2018 2046 03/28/2018 1635 DNR [id number]  Vaughan Basta, MD Inpatient   12/23/2016 2202 12/25/2016 2121 Full Code [id number]  Demetrios Loll, MD ED    Questions for Most Recent Historical Code Status (Order [id number])    Question Answer Comment   In the event of cardiac or respiratory ARREST Do not call a "code blue"    In the event of cardiac or respiratory ARREST Do not perform Intubation, CPR, defibrillation  or ACLS    In the event of cardiac or respiratory ARREST Use medication by any route, position, wound care, and other measures to relive pain and suffering. May use oxygen, suction and manual treatment of airway obstruction as needed for comfort.        TOTAL TIME TAKING CARE OF THIS PATIENT: 45 minutes.    Avel Peace Salary M.D on 05/03/2018   Between 7am to 6pm - Pager -  [phone]  After 6pm go to [email or web address] - password [email or web address] [facility]  Office  [phone]  CC: Primary care physician; [name], MD   Note: This dictation was prepared with Dragon dictation along with smaller phrase technology. Any transcriptional errors that result from this process are unintentional.

## 2018-05-03 NOTE — Progress Notes (Signed)
Family Meeting Note  Advance Directive:yes  Today a meeting took place with the Patient.  Patient is unable to participate due ST:MHDQQI capacity Dementia   The following clinical team members were present during this meeting:MD  The following were discussed:Patient's diagnosis: Dementia, right leg pain, chronic kidney disease, history of DVT/PE, GERD, hypertension, UTI, acute encephalopathy, dehydration, Patient's progosis: Unable to determine and Goals for treatment: Full Code  Additional follow-up to be provided: prn  Time spent during discussion:20 minutes  Gorden Harms, MD

## 2018-05-03 NOTE — Progress Notes (Signed)

## 2018-05-04 LAB — CBC WITH DIFFERENTIAL/PLATELET
Basophils Absolute: 0.1 10*3/uL (ref 0–0.1)
Basophils Relative: 1 %
Eosinophils Absolute: 0.1 10*3/uL (ref 0–0.7)
Eosinophils Relative: 1 %
HCT: 35.7 % (ref 35.0–47.0)
HEMOGLOBIN: 12.1 g/dL (ref 12.0–16.0)
LYMPHS ABS: 1.8 10*3/uL (ref 1.0–3.6)
LYMPHS PCT: 15 %
MCH: 30.8 pg (ref 26.0–34.0)
MCHC: 34 g/dL (ref 32.0–36.0)
MCV: 90.5 fL (ref 80.0–100.0)
Monocytes Absolute: 1.2 10*3/uL — ABNORMAL HIGH (ref 0.2–0.9)
Monocytes Relative: 10 %
NEUTROS ABS: 9 10*3/uL — AB (ref 1.4–6.5)
NEUTROS PCT: 73 %
Platelets: 280 10*3/uL (ref 150–440)
RBC: 3.94 MIL/uL (ref 3.80–5.20)
RDW: 14.1 % (ref 11.5–14.5)
WBC: 12.3 10*3/uL — ABNORMAL HIGH (ref 3.6–11.0)

## 2018-05-04 LAB — BASIC METABOLIC PANEL
Anion gap: 8 (ref 5–15)
BUN: 16 mg/dL (ref 6–20)
CHLORIDE: 108 mmol/L (ref 101–111)
CO2: 23 mmol/L (ref 22–32)
Calcium: 8.2 mg/dL — ABNORMAL LOW (ref 8.9–10.3)
Creatinine, Ser: 0.94 mg/dL (ref 0.44–1.00)
GFR calc non Af Amer: 52 mL/min — ABNORMAL LOW (ref >60)
Glucose, Bld: 89 mg/dL (ref 65–99)
POTASSIUM: 4 mmol/L (ref 3.5–5.1)
Sodium: 139 mmol/L (ref 135–145)

## 2018-05-04 LAB — TROPONIN I: Troponin I: <0.03 ng/mL (ref <0.03)

## 2018-05-04 MED ORDER — DONEPEZIL HCL 5 MG PO TABS
10.0000 mg | ORAL_TABLET | Freq: Every day | ORAL | Status: DC
  Administered 2018-05-04: 20:00:00 10 mg via ORAL
  Filled 2018-05-04 (×2): qty 2

## 2018-05-04 MED ORDER — SODIUM CHLORIDE 0.9 % IV SOLN
INTRAVENOUS | Status: DC
  Administered 2018-05-04 (×2): via INTRAVENOUS

## 2018-05-04 MED ORDER — MEMANTINE HCL 5 MG PO TABS
10.0000 mg | ORAL_TABLET | Freq: Two times a day (BID) | ORAL | Status: DC
  Administered 2018-05-04 – 2018-05-05 (×3): 10 mg via ORAL
  Filled 2018-05-04 (×3): qty 2

## 2018-05-04 MED ORDER — HYDROCOD POLST-CPM POLST ER 10-8 MG/5ML PO SUER
5.0000 mL | Freq: Two times a day (BID) | ORAL | Status: DC
  Administered 2018-05-04 – 2018-05-05 (×3): 5 mL via ORAL
  Filled 2018-05-04 (×3): qty 5

## 2018-05-04 NOTE — NC FL2 (Signed)
[place] LEVEL OF CARE SCREENING TOOL     IDENTIFICATION  Patient Name: Erin Shannon Birthdate: 1927/03/16 Sex: female Admission Date (Current Location): 05/03/2018  [facility] and [place] Number:  Engineering geologist and Address:  [facility], [address] [place], [place] [address]      Provider Number: [id number]  Attending Physician Name and Address:  Gladstone Lighter, MD  Relative Name and Phone Number:       Current Level of Care: Hospital Recommended Level of Care: [place] Prior Approval Number:    Date Approved/Denied:   PASRR Number:    Discharge Plan: SNF    Current Diagnoses: Patient Active Problem List   Diagnosis Date Noted  . Encephalopathy acute 05/03/2018  . Osteoarthritis involving multiple joints on both sides of body 04/23/2018  . Pulmonary emboli ([place]) 04/06/2018  . Acute lower UTI 03/26/2018  . UTI (urinary tract infection) 12/24/2016  . Mobitz (type) I (Wenckebach's) atrioventricular block 12/24/2016  . Demand ischemia ([place]) 12/24/2016  . Weakness 12/24/2016  . ARF (acute renal failure) ([place]) 12/23/2016  . Chronic venous insufficiency 07/27/2014  . BRADYCARDIA 12/19/2010  . DIASTOLIC HEART FAILURE, CHRONIC 12/19/2010  . HYPERTENSION, BENIGN 02/06/2009  . PALPITATIONS 02/06/2009    Orientation RESPIRATION BLADDER Height & Weight     Self  Normal Incontinent Weight: 132 lb (59.9 kg) Height:  4\' 11"  (149.9 cm)  BEHAVIORAL SYMPTOMS/MOOD NEUROLOGICAL BOWEL NUTRITION STATUS  (none) (none) Continent Diet(Heart Healthy )  AMBULATORY STATUS COMMUNICATION OF NEEDS Skin   Extensive Assist Verbally Normal                       Personal Care Assistance Level of Assistance  Bathing, Feeding, Dressing Bathing Assistance: Limited assistance Feeding assistance: Independent Dressing Assistance: Limited assistance     Functional Limitations Info  Sight, Hearing, Speech Sight Info:  Adequate Hearing Info: Adequate Speech Info: Adequate    SPECIAL CARE FACTORS FREQUENCY  OT (By licensed OT), PT (By licensed PT)                    Contractures Contractures Info: Not present    Additional Factors Info  Code Status, Allergies Code Status Info: Full Code  Allergies Info: NKA           Current Medications (05/04/2018):  This is the current hospital active medication list Current Facility-Administered Medications  Medication Dose Route Frequency Provider Last Rate Last Dose  . 0.9 %  sodium chloride infusion   Intravenous Continuous Gladstone Lighter, MD 75 mL/hr at 05/04/18 0808    . acetaminophen (TYLENOL) tablet 650 mg  650 mg Oral Q6H PRN Salary, Montell D, MD       Or  . acetaminophen (TYLENOL) suppository 650 mg  650 mg Rectal Q6H PRN Salary, Montell D, MD      . apixaban (ELIQUIS) tablet 5 mg  5 mg Oral BID Salary, Montell D, MD   5 mg at 05/04/18 1012  . calcium-vitamin D (OSCAL WITH D) 500-200 MG-UNIT per tablet 1 tablet  1 tablet Oral Daily Salary, Montell D, MD   1 tablet at 05/04/18 1013  . cefTRIAXone (ROCEPHIN) 1 g in sodium chloride 0.9 % 100 mL IVPB  1 g Intravenous Q24H Salary, Montell D, MD      . diltiazem (CARDIZEM CD) 24 hr capsule 240 mg  240 mg Oral Daily Salary, Montell D, MD   240 mg at 05/04/18  1013  . famotidine (PEPCID) IVPB 20 mg premix  20 mg Intravenous Q24H Gorden Harms, MD   Stopped at 05/03/18 1745  . multivitamin with minerals tablet 1 tablet  1 tablet Oral Daily Salary, Montell D, MD   1 tablet at 05/04/18 1013  . ondansetron (ZOFRAN) tablet 4 mg  4 mg Oral Q6H PRN Salary, Montell D, MD       Or  . ondansetron (ZOFRAN) injection 4 mg  4 mg Intravenous Q6H PRN Salary, Montell D, MD      . pravastatin (PRAVACHOL) tablet 40 mg  40 mg Oral QPM Salary, Montell D, MD   40 mg at 05/03/18 1744  . senna-docusate (Senokot-S) tablet 1 tablet  1 tablet Oral QHS PRN Salary, Avel Peace, MD         Discharge Medications: Please  see discharge summary for a list of discharge medications.  Relevant Imaging Results:  Relevant Lab Results:   Additional Information    Jarid Sasso  Louretta Shorten, LCSWA

## 2018-05-04 NOTE — Evaluation (Signed)
Physical Therapy Evaluation Patient Details Name: Erin Shannon MRN: [id number] DOB: 1927-06-07 Today's Date: 05/04/2018   History of Present Illness  Patient is an 84 year old female admitted from [place] due to acute UTI, encephalopathy, AMS.   Clinical Impression  Patient alert upon evaluation, caregiver present throughout session providing information as patient has dementia. Patient demonstrates decreased safety awareness. B UE strength WFL, LE strength WFL, however patient has pain in right knee with activity. Patient received wearing KI on right knee. Patient requires moderate assist to perform bed mobility and mod assist to perform sit to stand transfer. Patient unwilling to attempt ambulation at this time. Caregiver reports she was ambulating short distances at [facility] with therapy. Patient will benefit from continued PT to address her weakenss and difficulty with basic mobility skills to return to [facility] at discharge.          Follow Up Recommendations Home health PT    Equipment Recommendations  None recommended by PT    Recommendations for Other Services       Precautions / Restrictions Precautions Precaution Comments: patient has pain in right knee and is wearing KI  Required Braces or Orthoses: Knee Immobilizer - Right      Mobility  Bed Mobility Overal bed mobility: Needs Assistance Bed Mobility: Supine to Sit;Sit to Supine     Supine to sit: Mod assist Sit to supine: Mod assist      Transfers Overall transfer level: Needs assistance Equipment used: Rolling walker (2 wheeled) Transfers: Sit to/from Stand Sit to Stand: Mod assist            Ambulation/Gait     Assistive device: Rolling walker (2 wheeled)       General Gait Details: patient requests to sit back down once standing due to that she bagan urinating, but had diaper on.  Declines ambulation at this time.   Stairs            Wheelchair Mobility     Modified Rankin (Stroke Patients Only)       Balance Overall balance assessment: Needs assistance                                           Pertinent Vitals/Pain Pain Assessment: No/denies pain    Home Living Family/patient expects to be discharged to:: Skilled nursing facility                 Additional Comments: patient was at [facility] for rehab and was being moved to assisted living prior to hospitalization     Prior Function Level of Independence: Needs assistance   Gait / Transfers Assistance Needed: patient using rw at baseline and only able to ambulate short distance with assistance per daughter who is present in the room.   ADL's / Homemaking Assistance Needed: patient requires assistance with ADLs        Hand Dominance        Extremity/Trunk Assessment        Lower Extremity Assessment Lower Extremity Assessment: Generalized weakness       Communication   Communication: No difficulties  Cognition Arousal/Alertness: Awake/alert Behavior During Therapy: Agitated Overall Cognitive Status: History of cognitive impairments - at baseline  General Comments      Exercises Other Exercises Other Exercises: B LE strengthening exercises: ap, heel slides on left, slr bilateral and hip abd/add bilateral x 10 reps each. Patient requires cues for pacing of exercises.     Assessment/Plan    PT Assessment Patient needs continued PT services  PT Problem List Decreased strength;Decreased mobility;Decreased safety awareness;Decreased activity tolerance;Decreased balance;Decreased cognition       PT Treatment Interventions Therapeutic activities;Gait training;Therapeutic exercise;Patient/family education;Functional mobility training    PT Goals (Current goals can be found in the Care Plan section)  Acute Rehab PT Goals Patient Stated Goal: to return to [facility] PT Goal  Formulation: With patient/family Time For Goal Achievement: 05/13/18 Potential to Achieve Goals: Good    Frequency Min 2X/week   Barriers to discharge        Co-evaluation               AM-PAC PT "6 Clicks" Daily Activity  Outcome Measure Difficulty turning over in bed (including adjusting bedclothes, sheets and blankets)?: Unable Difficulty moving from lying on back to sitting on the side of the bed? : Unable Difficulty sitting down on and standing up from a chair with arms (e.g., wheelchair, bedside commode, etc,.)?: Unable Help needed moving to and from a bed to chair (including a wheelchair)?: A Lot Help needed walking in hospital room?: A Lot Help needed climbing 3-5 steps with a railing? : Total 6 Click Score: 8    End of Session Equipment Utilized During Treatment: Gait belt;Right knee immobilizer Activity Tolerance: Patient limited by pain;Treatment limited secondary to agitation Patient left: in bed;with family/visitor present;with call bell/phone within reach Nurse Communication: Other (comment) PT Visit Diagnosis: Unsteadiness on feet (R26.81);Other abnormalities of gait and mobility (R26.89);Muscle weakness (generalized) (M62.81);Difficulty in walking, not elsewhere classified (R26.2);Pain Pain - Right/Left: Right Pain - part of body: Knee    Time: 1115-1140 PT Time Calculation (min) (ACUTE ONLY): 25 min   Charges:   PT Evaluation $PT Eval Low Complexity: 1 Low PT Treatments $Therapeutic Exercise: 8-22 mins   PT G Codes:       Ambria Mayfield, PT, GCS 05/04/18,12:24 PM

## 2018-05-04 NOTE — Progress Notes (Signed)
[place] [facility] [place] NAME: Erin Shannon    MR#:  [id number]  DATE OF BIRTH:  [birth year]  SUBJECTIVE:  CHIEF COMPLAINT:   Chief Complaint  Patient presents with  . Weakness   -Brought from [place] secondary to altered mental status.  Mentation is much improved and close to baseline according to daughter who is at bedside. -Noted to have possible UTI and dehydration.  Also complains of cough  REVIEW OF SYSTEMS:  Review of Systems  Unable to perform ROS: Dementia    DRUG ALLERGIES:  No Known Allergies  VITALS:  Blood pressure 134/66, pulse 76, temperature 98.4 F (36.9 C), temperature source Oral, resp. rate 16, height 4\' 11"  (1.499 m), weight 59.9 kg (132 lb), SpO2 95 %.  PHYSICAL EXAMINATION:  Physical Exam  GENERAL:  84 y.o.-year-old elderly patient lying in the bed with no acute distress.  EYES: Pupils equal, round, reactive to light and accommodation. No scleral icterus. Extraocular muscles intact.  HEENT: Head atraumatic, normocephalic. Oropharynx and nasopharynx clear.  NECK:  Supple, no jugular venous distention. No thyroid enlargement, no tenderness.  LUNGS: coarse breath sounds at the bases, but moving air bilaterally, no wheezing, rales,rhonchi or crepitation. No use of accessory muscles of respiration.  CARDIOVASCULAR: S1, S2 normal. No rubs, or gallops. 2/6 systolic murmur is present ABDOMEN: Soft, nontender, nondistended. Bowel sounds present. No organomegaly or mass.  EXTREMITIES: No pedal edema, cyanosis, or clubbing.  NEUROLOGIC: Cranial nerves II through XII are intact. Muscle strength 5/5 in all extremities. Sensation intact. Gait not checked. Global weakness noted PSYCHIATRIC: The patient is alert and oriented to self.  SKIN: No obvious rash, lesion, or ulcer.    LABORATORY PANEL:   CBC Recent Labs  Lab 05/04/18 0356  WBC 12.3*  HGB 12.1  HCT 35.7  PLT 280    ------------------------------------------------------------------------------------------------------------------  Chemistries  Recent Labs  Lab 05/03/18 1312 05/04/18 0356  NA 136 139  K 4.4 4.0  CL 101 108  CO2 25 23  GLUCOSE 167* 89  BUN 13 16  CREATININE 1.24* 0.94  CALCIUM 9.0 8.2*  AST 25  --   ALT 15  --   ALKPHOS 54  --   BILITOT 1.2  --    ------------------------------------------------------------------------------------------------------------------  Cardiac Enzymes Recent Labs  Lab 05/04/18 0356  TROPONINI <0.03   ------------------------------------------------------------------------------------------------------------------  RADIOLOGY:  Dg Chest 2 View  Result Date: 05/03/2018 CLINICAL DATA:  This.  History of breast carcinoma.  Hypertension. EXAM: CHEST - 2 VIEW COMPARISON:  Chest radiograph and chest CT Apr 06, 2018 FINDINGS: There is a small left pleural effusion. There is an area of scarring in the lateral right base. There is no edema or consolidation. Heart size is upper normal with pulmonary vascularity normal. No adenopathy evident. Bones are osteoporotic. Bones are osteoporotic with advanced arthropathy in the shoulders bilaterally. There is degenerative change throughout the thoracic spine as well. There is anterior wedging of the T12 vertebral body, stable. IMPRESSION: Small left pleural effusion. Mild scarring lateral right base. No edema or consolidation. Stable cardiac silhouette. No adenopathy. Advanced arthropathy noted in both shoulders. Bones are osteoporotic. Electronically Signed   By: Lowella Grip III M.D.   On: 05/03/2018 14:08   Ct Head Wo Contrast  Result Date: 05/03/2018 CLINICAL DATA:  84 year old female with altered mental status. Initial encounter. EXAM: CT HEAD WITHOUT CONTRAST TECHNIQUE: Contiguous axial images were obtained from the base of the skull through the vertex without intravenous  contrast. COMPARISON:  12/23/2016 CT.  FINDINGS: Brain: No intracranial hemorrhage or CT evidence of large acute infarct. Prominent chronic microvascular changes. Global atrophy. Ventricular prominence unchanged and felt to be related to atrophy rather than hydrocephalus. No intracranial mass lesion noted on this unenhanced exam. Vascular: Vascular calcifications. Skull: Negative Sinuses/Orbits: No acute orbital abnormality. Visualized paranasal sinuses are clear. Other: Mastoid air cells and middle ear cavities are clear. IMPRESSION: No acute intracranial abnormality. Prominent chronic microvascular changes. Atrophy. Electronically Signed   By: Genia Del M.D.   On: 05/03/2018 14:49    EKG:   Orders placed or performed during the hospital encounter of 04/06/18  . ED EKG  . ED EKG  . EKG 12-Lead  . EKG 12-Lead  . EKG    ASSESSMENT AND PLAN:   84 year old female with past medical history significant for history of breast cancer status post mastectomy, CKD, dementia, hypertension, arthritis mostly wheelchair-bound from a long-term care facility was brought in secondary to altered mental status  1.  Acute metabolic encephalopathy-secondary to underlying infection on top of dementia -Patient is alert and oriented to self at this time.  This is her baseline according to daughter. -Continue fluids and treat the underlying infection  2.  Acute cystitis-follow-up on urine cultures. -Continue Rocephin for now  3.  History of atrial fibrillation-continue Cardizem, on Eliquis for anticoagulation  4.  Dementia-seems to be at baseline.  Restart Aricept and Namenda  5.  DVT prophylaxis-already on Eliquis  PT consulted.  Anticipate discharge tomorrow Daughter updated at bedside   All the records are reviewed and case discussed with Care Management/Social Workerr. Management plans discussed with the patient, family and they are in agreement.  CODE STATUS: Full Code  TOTAL TIME TAKING CARE OF THIS PATIENT: 38 minutes.    POSSIBLE D/C IN 1-2 DAYS, DEPENDING ON CLINICAL CONDITION.   Damali Broadfoot M.D on 05/04/2018 at 3:25 PM  Between 7am to 6pm - Pager - [phone]  After 6pm go to [email or web address] - password [email or web address] [facility]  Office  [phone]  CC: Primary care physician; [name], MD

## 2018-05-04 NOTE — Clinical Social Work Note (Signed)
Clinical Social Work Assessment  Patient Details  Name: Erin Shannon MRN: [id number] Date of Birth: 08/14/1927  Date of referral:  05/04/18               Reason for consult:  Facility Placement                Permission sought to share information with:  Case Manager, Facility Contact Representative, Family Supports Permission granted to share information::  Yes, Verbal Permission Granted  Name::        Agency::     Relationship::     Contact Information:     Housing/Transportation Living arrangements for the past 2 months:  Skilled Nursing Facility Source of Information:  Patient Patient Interpreter Needed:  None Criminal Activity/Legal Involvement Pertinent to Current Situation/Hospitalization:  No - Comment as needed Significant Relationships:  Adult Children Lives with:  Facility Resident Do you feel safe going back to the place where you live?  Yes Need for family participation in patient care:  Yes (Comment)  Care giving concerns:  Patient is from [facility]. She has been there for rehab but recently was transitioned to long term care.    Social Worker assessment / plan:  CSW consulted for facility placement. CSW met with patient and daughter Erin Shannon at bedside. Patient is alert to self only. Patient's daughter states that she lives at [facility] and should return there when ready. Daughter states that she needs to be in long term skilled nursing and family is supportive of this. CSW contacted Taylor, liaison for [facility] and Taylor states that patient is from their long term skilled nursing care and can return when ready. CSW will continue to follow for discharge planning.   Employment status:  Retired Insurance information:  Managed Medicare PT Recommendations:  Home with Home Health Information / Referral to community resources:     Patient/Family's Response to care:  Patient alert and oriented to self only   Patient/Family's Understanding of and  Emotional Response to Diagnosis, Current Treatment, and Prognosis:  Patient's daughter thanked CSW for assistance.   Emotional Assessment Appearance:  Appears stated age Attitude/Demeanor/Rapport:    Affect (typically observed):  Accepting, Calm Orientation:  Oriented to Self Alcohol / Substance use:  Not Applicable Psych involvement (Current and /or in the community):  No (Comment)  Discharge Needs  Concerns to be addressed:  Discharge Planning Concerns Readmission within the last 30 days:  Yes Current discharge risk:  Cognitively Impaired Barriers to Discharge:  Continued Medical Work up     , LCSWA 05/04/2018, 12:25 PM  

## 2018-05-05 LAB — BASIC METABOLIC PANEL
ANION GAP: 5 (ref 5–15)
BUN: 11 mg/dL (ref 6–20)
CO2: 24 mmol/L (ref 22–32)
Calcium: 7.8 mg/dL — ABNORMAL LOW (ref 8.9–10.3)
Chloride: 109 mmol/L (ref 101–111)
Creatinine, Ser: 0.76 mg/dL (ref 0.44–1.00)
GFR calc non Af Amer: >60 mL/min (ref >60)
Glucose, Bld: 84 mg/dL (ref 65–99)
Potassium: 3.6 mmol/L (ref 3.5–5.1)
Sodium: 138 mmol/L (ref 135–145)

## 2018-05-05 LAB — RPR: RPR Ser Ql: NONREACTIVE

## 2018-05-05 MED ORDER — CEPHALEXIN 500 MG PO CAPS: 500.0000 mg | ORAL_CAPSULE | Freq: Two times a day (BID) | ORAL | 0 refills | Status: DC

## 2018-05-05 MED ORDER — CEPHALEXIN 500 MG PO CAPS
500.0000 mg | ORAL_CAPSULE | Freq: Two times a day (BID) | ORAL | Status: DC
  Administered 2018-05-05: 500 mg via ORAL
  Filled 2018-05-05: qty 1

## 2018-05-05 NOTE — Discharge Summary (Signed)
[facility] [place] NAME: Erin Shannon    MR#:  [id number]  DATE OF BIRTH:  09/07/1927  DATE OF ADMISSION:  05/03/2018 ADMITTING PHYSICIAN: Gorden Harms, MD  DATE OF DISCHARGE: 05/05/2018  PRIMARY CARE PHYSICIAN: [name], MD    ADMISSION DIAGNOSIS:  Urinary tract infection without hematuria, site unspecified [W10.9] Acute metabolic encephalopathy [N23.55]  DISCHARGE DIAGNOSIS:  Acute Encephalopathy --improved Acute Cysitis  SECONDARY DIAGNOSIS:   Past Medical History:  Diagnosis Date  . Arrhythmia   . Arthritis   . Breast cancer [facility])    s/p mastectomy  . Cancer ([place])    breast  . Chronic kidney disease    stones  . Dementia   . Diastolic dysfunction    echo 11/07. EF 60-65%. mild to moderate PI  . GERD (gastroesophageal reflux disease)   . HTN (hypertension)   . Hyperlipidemia   . Hypertension   . Memory loss   . Osteoarthritis (arthritis due to wear and tear of joints)   . Osteoporosis, post-menopausal   . Palpitations    monitor with PACs/PVCs. neg myoview 2007  . Syncope and collapse     HOSPITAL COURSE:   84 year old female with past medical history significant for history of breast cancer status post mastectomy, CKD, dementia, hypertension, arthritis mostly wheelchair-bound from a long-term care facility was brought in secondary to altered mental status  1.  Acute metabolic encephalopathy-secondary to underlying infection on top of dementia -Patient is alert and oriented to self at this time.  This is her baseline according to daughter. -received IV fluids and treat the underlying infection  2.  Acute cystitis-follow-up on urine cultures--so far negative -Continue Rocephin ---change to empiric kelfex  3.  History of atrial fibrillation-continue Cardizem, on Eliquis for anticoagulation  4.  Dementia-seems to be at baseline.  Restart Aricept and Namenda  5.  DVT prophylaxis-already on  Eliquis  PT consulted--cont PT for now at [facility] to transition to long term at [facility] per dter Daughter updated at bedside D/c today to [facility].   CONSULTS OBTAINED:    DRUG ALLERGIES:  No Known Allergies  DISCHARGE MEDICATIONS:   Allergies as of 05/05/2018   No Known Allergies     Medication List    TAKE these medications   acetaminophen 325 MG tablet Commonly known as:  TYLENOL Take 2 tablets (650 mg total) by mouth every 6 (six) hours as needed for mild pain (or Fever >/= 101).   alendronate 70 MG tablet Commonly known as:  FOSAMAX Take 70 mg by mouth every Monday. Take with a full glass of water on an empty stomach.   apixaban 5 MG Tabs tablet Commonly known as:  ELIQUIS Take 5 mg by mouth 2 (two) times daily.   calcium citrate-vitamin D 315-200 MG-UNIT tablet Commonly known as:  CITRACAL+D Take 1 tablet by mouth daily.   cephALEXin 500 MG capsule Commonly known as:  KEFLEX Take 1 capsule (500 mg total) by mouth every 12 (twelve) hours.   COMPLETE WOMENS Tabs Take 1 tablet by mouth daily.   diltiazem 240 MG 24 hr capsule Commonly known as:  DILACOR XR Take 240 mg by mouth daily.   donepezil 10 MG tablet Commonly known as:  ARICEPT Take 10 mg by mouth every evening.   memantine 10 MG tablet Commonly known as:  NAMENDA Take 10 mg by mouth 2 (two) times daily.   pravastatin 40 MG tablet Commonly known as:  PRAVACHOL  Take 40 mg by mouth every evening.   senna-docusate 8.6-50 MG tablet Commonly known as:  Senokot-S Take 1 tablet by mouth at bedtime as needed for mild constipation.   traZODone 50 MG tablet Commonly known as:  DESYREL Take 50 mg by mouth at bedtime as needed for sleep.       If you experience worsening of your admission symptoms, develop shortness of breath, life threatening emergency, suicidal or homicidal thoughts you must seek medical attention immediately by calling 911 or calling your MD immediately  if symptoms less  severe.  You Must read complete instructions/literature along with all the possible adverse reactions/side effects for all the Medicines you take and that have been prescribed to you. Take any new Medicines after you have completely understood and accept all the possible adverse reactions/side effects.   Please note  You were cared for by a hospitalist during your hospital stay. If you have any questions about your discharge medications or the care you received while you were in the hospital after you are discharged, you can call the unit and asked to speak with the hospitalist on call if the hospitalist that took care of you is not available. Once you are discharged, your primary care physician will handle any further medical issues. Please note that NO REFILLS for any discharge medications will be authorized once you are discharged, as it is imperative that you return to your primary care physician (or establish a relationship with a primary care physician if you do not have one) for your aftercare needs so that they can reassess your need for medications and monitor your lab values. Today   SUBJECTIVE   Doing well per dter and no issues according to RN  VITAL SIGNS:  Blood pressure (!) 129/56, pulse 60, temperature 98 F (36.7 C), temperature source Oral, resp. rate 18, height 4\' 11"  (1.499 m), weight 59.9 kg (132 lb), SpO2 93 %.  I/O:    Intake/Output Summary (Last 24 hours) at 05/05/2018 0834 Last data filed at 05/05/2018 0532 Gross per 24 hour  Intake 1731.25 ml  Output 1400 ml  Net 331.25 ml    PHYSICAL EXAMINATION:  GENERAL:  84 y.o.-year-old patient lying in the bed with no acute distress.  EYES: Pupils equal, round, reactive to light and accommodation. No scleral icterus. Extraocular muscles intact.  HEENT: Head atraumatic, normocephalic. Oropharynx and nasopharynx clear.  NECK:  Supple, no jugular venous distention. No thyroid enlargement, no tenderness.  LUNGS: Normal breath  sounds bilaterally, no wheezing, rales,rhonchi or crepitation. No use of accessory muscles of respiration.  CARDIOVASCULAR: S1, S2 normal. No murmurs, rubs, or gallops.  ABDOMEN: Soft, non-tender, non-distended. Bowel sounds present. No organomegaly or mass.  EXTREMITIES: No pedal edema, cyanosis, or clubbing.  NEUROLOGIC: grossly non focal. Moves all extremties well PSYCHIATRIC: The patient is alert   SKIN: No obvious rash, lesion, or ulcer.   DATA REVIEW:   CBC  Recent Labs  Lab 05/04/18 0356  WBC 12.3*  HGB 12.1  HCT 35.7  PLT 280    Chemistries  Recent Labs  Lab 05/03/18 1312  05/05/18 0558  NA 136   < > 138  K 4.4   < > 3.6  CL 101   < > 109  CO2 25   < > 24  GLUCOSE 167*   < > 84  BUN 13   < > 11  CREATININE 1.24*   < > 0.76  CALCIUM 9.0   < > 7.8*  AST 25  --   --   ALT 15  --   --   ALKPHOS 54  --   --   BILITOT 1.2  --   --    < > = values in this interval not displayed.    Microbiology Results   Recent Results (from the past 240 hour(s))  Urine Culture     Status: None (Preliminary result)   Collection Time: 05/03/18  2:06 PM  Result Value Ref Range Status   Specimen Description   Final    URINE, RANDOM Performed at [facility], [address], [place], [place] [address]    Special Requests   Final    NONE Performed at [facility], [address], [place], [place] [address]    Culture   Final    CULTURE REINCUBATED FOR BETTER GROWTH Performed at [facility], [place] [address], [place], [place] [address]    Report Status PENDING  Incomplete    RADIOLOGY:  Dg Chest 2 View  Result Date: 05/03/2018 CLINICAL DATA:  This.  History of breast carcinoma.  Hypertension. EXAM: CHEST - 2 VIEW COMPARISON:  Chest radiograph and chest CT Apr 06, 2018 FINDINGS: There is a small left pleural effusion. There is an area of scarring in the lateral right base. There is no edema or consolidation. Heart size is upper normal with pulmonary  vascularity normal. No adenopathy evident. Bones are osteoporotic. Bones are osteoporotic with advanced arthropathy in the shoulders bilaterally. There is degenerative change throughout the thoracic spine as well. There is anterior wedging of the T12 vertebral body, stable. IMPRESSION: Small left pleural effusion. Mild scarring lateral right base. No edema or consolidation. Stable cardiac silhouette. No adenopathy. Advanced arthropathy noted in both shoulders. Bones are osteoporotic. Electronically Signed   By: Lowella Grip III M.D.   On: 05/03/2018 14:08   Ct Head Wo Contrast  Result Date: 05/03/2018 CLINICAL DATA:  84 year old female with altered mental status. Initial encounter. EXAM: CT HEAD WITHOUT CONTRAST TECHNIQUE: Contiguous axial images were obtained from the base of the skull through the vertex without intravenous contrast. COMPARISON:  12/23/2016 CT. FINDINGS: Brain: No intracranial hemorrhage or CT evidence of large acute infarct. Prominent chronic microvascular changes. Global atrophy. Ventricular prominence unchanged and felt to be related to atrophy rather than hydrocephalus. No intracranial mass lesion noted on this unenhanced exam. Vascular: Vascular calcifications. Skull: Negative Sinuses/Orbits: No acute orbital abnormality. Visualized paranasal sinuses are clear. Other: Mastoid air cells and middle ear cavities are clear. IMPRESSION: No acute intracranial abnormality. Prominent chronic microvascular changes. Atrophy. Electronically Signed   By: Genia Del M.D.   On: 05/03/2018 14:49     Management plans discussed with the patient, family and they are in agreement.  CODE STATUS:     Code Status Orders  (From admission, onward)        Start     Ordered   05/03/18 1555  Full code  Continuous     05/03/18 1554    Code Status History    Date Active Date Inactive Code Status Order ID Comments User Context   03/26/2018 2046 03/28/2018 1635 DNR [id number]  Vaughan Basta, MD Inpatient   12/23/2016 2202 12/25/2016 2121 Full Code [id number]  Demetrios Loll, MD ED    Advance Directive Documentation     Most Recent Value  Type of Advance Directive  Healthcare Power of Attorney, Living will  Pre-existing out of facility DNR order (yellow form or  pink MOST form)  -  "MOST" Form in Place?  -      TOTAL TIME TAKING CARE OF THIS PATIENT: *40* minutes.    Fritzi Mandes M.D on 05/05/2018 at 8:34 AM  Between 7am to 6pm - Pager - [phone] After 6pm go to [email or web address] - password [email or web address] [facility]  Office  [phone]  CC: Primary care physician; [name], MD

## 2018-05-05 NOTE — Progress Notes (Addendum)
Pt is being discharged to [facility]. Discharge papers given and explained to pt's daughter, verbalized understanding. Meds and f/u appointment reviewed. Called report to [place], [place]. Awaiting EMS.

## 2018-05-05 NOTE — Clinical Social Work Note (Signed)
Patient is medically ready for discharge today. CSW notified patient and daughter [name] at bedside. They are in agreement with discharge back to [facility]. CSW notified Lovena Le at [facility] that patient will come back today. RN to call report and call for transport.   [place], [place]

## 2018-05-06 LAB — URINE CULTURE: Culture: >=100000 — AB

## 2018-05-12 ENCOUNTER — Non-Acute Institutional Stay (SKILLED_NURSING_FACILITY): Payer: Medicare Other | Admitting: Gerontology

## 2018-05-12 ENCOUNTER — Encounter: Payer: Self-pay | Admitting: Gerontology

## 2018-05-12 DIAGNOSIS — A0472 Enterocolitis due to Clostridium difficile, not specified as recurrent: Secondary | ICD-10-CM

## 2018-05-12 DIAGNOSIS — M79604 Pain in right leg: Secondary | ICD-10-CM

## 2018-05-17 ENCOUNTER — Other Ambulatory Visit
Admission: RE | Admit: 2018-05-17 | Discharge: 2018-05-17 | Disposition: A | Discharge status: Home / Self Care | Payer: Medicare Other | Source: Ambulatory Visit | Attending: Internal Medicine | Admitting: Internal Medicine

## 2018-05-17 DIAGNOSIS — R195 Other fecal abnormalities: Secondary | ICD-10-CM | POA: Insufficient documentation

## 2018-05-17 LAB — C DIFFICILE QUICK SCREEN W PCR REFLEX
C DIFFICLE (CDIFF) ANTIGEN: POSITIVE — AB
C Diff interpretation: DETECTED
C Diff toxin: POSITIVE — AB

## 2018-05-28 ENCOUNTER — Other Ambulatory Visit
Admission: RE | Admit: 2018-05-28 | Discharge: 2018-05-28 | Disposition: A | Discharge status: Home / Self Care | Payer: Medicare Other | Source: Ambulatory Visit | Attending: Gerontology | Admitting: Gerontology

## 2018-05-28 DIAGNOSIS — B998 Other infectious disease: Secondary | ICD-10-CM | POA: Insufficient documentation

## 2018-05-28 DIAGNOSIS — B9689 Other specified bacterial agents as the cause of diseases classified elsewhere: Secondary | ICD-10-CM | POA: Diagnosis present

## 2018-05-28 DIAGNOSIS — A0472 Enterocolitis due to Clostridium difficile, not specified as recurrent: Secondary | ICD-10-CM | POA: Insufficient documentation

## 2018-05-28 DIAGNOSIS — M79604 Pain in right leg: Secondary | ICD-10-CM | POA: Insufficient documentation

## 2018-05-28 LAB — C DIFFICILE QUICK SCREEN W PCR REFLEX
C Diff antigen: POSITIVE — AB
C Diff interpretation: DETECTED
C Diff toxin: POSITIVE — AB

## 2018-05-28 NOTE — Assessment & Plan Note (Signed)
Pt reports intermittent right leg pain. Describes pain as an ache, heavy feeling. Denies sharp pain, denies burning or tingling. B-pedal pulses equal. No chest pain or shortness of breath. Pt is currently being treated for DVT in the RLE with PE.

## 2018-05-28 NOTE — Assessment & Plan Note (Signed)
Pt c/o diarrhea. Nursing reports 2-3 stools/day. Described as mucussy, blood tinged. Not malodorous. Pt is on Keflex for UTI. Denies abdominal pain. Afebrile. Denies nausea.

## 2018-05-28 NOTE — Progress Notes (Signed)
Location:    Nursing Home Room Number: 322B Place of Service:  SNF (31) Provider:  Toni Arthurs, NP-C  [name], MD  Patient Care Team: [name], MD as PCP - General (Family Medicine)  Extended Emergency Contact Information Primary Emergency Contact: [name] Address: [place], [place] Phone: [phone] Work Phone: [phone] Relation: Daughter Secondary Emergency Contact: Anast[place], [place] Work Phone: [phone] Mobile Phone: [phone] Relation: Son  Code Status:  FULL Goals of care: Advanced Directive information Advanced Directives 05/12/2018  Does Patient Have a Medical Advance Directive? Yes  Type of Paramedic of [place];Living will  Does patient want to make changes to medical advance directive? No - Patient declined  Copy of [place] in Chart? Yes     Chief Complaint  Patient presents with  . Acute Visit    diarrhea    HPI:  Pt is a 84 y.o. female seen today for an acute visit for   Clostridium difficile enteritis Pt c/o diarrhea. Nursing reports 2-3 stools/day. Described as mucussy, blood tinged. Not malodorous. Pt is on Keflex for UTI. Denies abdominal pain. Afebrile. Denies nausea.    Leg pain, lateral, right Pt reports intermittent right leg pain. Describes pain as an ache, heavy feeling. Denies sharp pain, denies burning or tingling. B-pedal pulses equal. No chest pain or shortness of breath. Pt is currently being treated for DVT in the RLE with PE.   Please note pt with limited verbal/cognitive ability. Unable to obtain complete ROS. Some ROS info obtained from staff and documentation.   Past Medical History:  Diagnosis Date  . Arrhythmia   . Arthritis   . Breast cancer [facility])    s/p mastectomy  . Cancer ([place])    breast  . Chronic kidney disease    stones  . Dementia   . Diastolic dysfunction    echo 11/07. EF 60-65%. mild to  moderate PI  . GERD (gastroesophageal reflux disease)   . HTN (hypertension)   . Hyperlipidemia   . Hypertension   . Memory loss   . Osteoarthritis (arthritis due to wear and tear of joints)   . Osteoporosis, post-menopausal   . Palpitations    monitor with PACs/PVCs. neg myoview 2007  . Syncope and collapse    Past Surgical History:  Procedure Laterality Date  . hysterectomy-unspecified area    . masectomy     R    No Known Allergies  Allergies as of 05/12/2018   No Known Allergies     Medication List        Accurate as of 05/12/18 11:59 PM. Always use your most recent med list.          acetaminophen 325 MG tablet Commonly known as:  TYLENOL Take 650 mg by mouth every 6 (six) hours as needed for mild pain or fever. for pain/ increased temp. May be administered orally, per G-tube if needed or rectally if unable to swallow (separate order). Maximum dose for 24 hours is 3,000 mg from all sources of Acetaminophen/ Tylenol   alendronate 70 MG tablet Commonly known as:  FOSAMAX Take 70 mg by mouth every Monday. Take with a full glass of water on an empty stomach.   apixaban 5 MG Tabs tablet Commonly known as:  ELIQUIS Take 5 mg by mouth 2 (two) times daily.   calcium citrate-vitamin D 315-200 MG-UNIT tablet Commonly known as:  CITRACAL+D Take 1 tablet by mouth daily.   cephALEXin 500 MG capsule Commonly known as:  KEFLEX Take 500 mg by mouth every 12 (twelve) hours.   COMPLETE WOMENS Tabs Take 1 tablet by mouth daily.   diltiazem 240 MG 24 hr capsule Commonly known as:  DILACOR XR Take 240 mg by mouth daily.   donepezil 10 MG tablet Commonly known as:  ARICEPT Take 10 mg by mouth every evening.   memantine 10 MG tablet Commonly known as:  NAMENDA Take 10 mg by mouth 2 (two) times daily.   pravastatin 40 MG tablet Commonly known as:  PRAVACHOL Take 40 mg by mouth every evening.   senna-docusate 8.6-50 MG tablet Commonly known as:  Senokot-S Take 1  tablet by mouth at bedtime as needed for mild constipation.   traZODone 50 MG tablet Commonly known as:  DESYREL Take 50 mg by mouth at bedtime as needed for sleep.       Review of Systems  Unable to perform ROS: Dementia  Constitutional: Negative for activity change, appetite change, chills, diaphoresis and fever.  HENT: Negative for congestion, mouth sores, nosebleeds, postnasal drip, sneezing, sore throat, trouble swallowing and voice change.   Respiratory: Negative for apnea, cough, choking, chest tightness, shortness of breath and wheezing.   Cardiovascular: Negative for chest pain, palpitations and leg swelling.  Gastrointestinal: Positive for diarrhea. Negative for abdominal distention, abdominal pain, constipation and nausea.  Genitourinary: Negative for difficulty urinating, dysuria, frequency and urgency.  Musculoskeletal: Positive for arthralgias (typical arthritis) and myalgias. Negative for back pain and gait problem.  Skin: Negative for color change, pallor, rash and wound.  Neurological: Negative for dizziness, tremors, syncope, speech difficulty, weakness, numbness and headaches.  Psychiatric/Behavioral: Positive for confusion. Negative for agitation and behavioral problems.  All other systems reviewed and are negative.   Immunization History  Administered Date(s) Administered  . Influenza,inj,Quad PF,6+ Mos 09/21/2017  . Influenza-Unspecified 11/13/2015  . Pneumococcal Conjugate-13 11/11/2013  . Pneumococcal Polysaccharide-23 04/07/2018   Pertinent  Health Maintenance Due  Topic Date Due  . DEXA SCAN  03/14/1994  . INFLUENZA VACCINE  06/30/2018  . PNA vac Low Risk Adult  Completed   No flowsheet data found. Functional Status Survey:    Vitals:   05/12/18 1605  BP: 124/60  Pulse: 68  Resp: 16  Temp: 98 F (36.7 C)  TempSrc: Oral  SpO2: 95%  Weight: 125 lb 6.4 oz (56.9 kg)  Height: 4\' 11"  (1.499 m)   Body mass index is 25.33 kg/m. Physical Exam    Constitutional: Vital signs are normal. She appears well-developed and well-nourished. She is active and cooperative. She does not appear ill. No distress.  HENT:  Head: Normocephalic and atraumatic.  Mouth/Throat: Uvula is midline, oropharynx is clear and moist and mucous membranes are normal. Mucous membranes are not pale, not dry and not cyanotic.  Eyes: Pupils are equal, round, and reactive to light. Conjunctivae, EOM and lids are normal.  Neck: Trachea normal, normal range of motion and full passive range of motion without pain. Neck supple. No JVD present. No tracheal deviation, no edema and no erythema present. No thyromegaly present.  Cardiovascular: Normal rate, regular rhythm, normal heart sounds, intact distal pulses and normal pulses. Exam reveals no gallop, no distant heart sounds and no friction rub.  No murmur heard. Pulses:      Dorsalis pedis pulses are 2+ on the right side, and 2+ on the left side.  No edema  Pulmonary/Chest: Effort normal  and breath sounds normal. No accessory muscle usage. No respiratory distress. She has no decreased breath sounds. She has no wheezes. She has no rhonchi. She has no rales. She exhibits no tenderness.  Abdominal: Soft. Normal appearance and bowel sounds are normal. She exhibits no distension and no ascites. There is no tenderness.  Musculoskeletal: Normal range of motion. She exhibits no edema.       Right lower leg: She exhibits tenderness.  Expected osteoarthritis, stiffness; Bilateral Calves soft, supple. Negative Homan's Sign. B- pedal pulses equal; RLE intermittent achiness  Neurological: She is alert. She has normal strength.  Skin: Skin is warm, dry and intact. She is not diaphoretic. No cyanosis. No pallor. Nails show no clubbing.  Psychiatric: She has a normal mood and affect. Her speech is normal. Judgment and thought content normal. She is agitated (at times). Cognition and memory are impaired. She exhibits abnormal recent memory.   Nursing note and vitals reviewed.   Labs reviewed: Recent Labs    05/03/18 1312 05/04/18 0356 05/05/18 0558  NA 136 139 138  K 4.4 4.0 3.6  CL 101 108 109  CO2 25 23 24   GLUCOSE 167* 89 84  BUN 13 16 11   CREATININE 1.24* 0.94 0.76  CALCIUM 9.0 8.2* 7.8*   Recent Labs    04/06/18 0946 04/11/18 0400 05/03/18 1312  AST 26 22 25   ALT 19 16 15   ALKPHOS 56 56 54  BILITOT 0.9 0.6 1.2  PROT 6.8 6.6 6.6  ALBUMIN 3.1* 3.0* 3.5   Recent Labs    04/06/18 0946  04/11/18 0400 05/03/18 1312 05/04/18 0356  WBC 18.2*   < > 11.4* 18.1* 12.3*  NEUTROABS 15.4*  --  8.4*  --  9.0*  HGB 14.0   < > 13.3 13.4 12.1  HCT 41.3   < > 39.8 40.9 35.7  MCV 92.6   < > 92.9 90.9 90.5  PLT 292   < > 355 299 280   < > = values in this interval not displayed.   Lab Results  Component Value Date   TSH 1.439 05/03/2018   No results found for: HGBA1C No results found for: CHOL, HDL, LDLCALC, LDLDIRECT, TRIG, CHOLHDL  Significant Diagnostic Results in last 30 days:  Dg Chest 2 View  Result Date: 05/03/2018 CLINICAL DATA:  This.  History of breast carcinoma.  Hypertension. EXAM: CHEST - 2 VIEW COMPARISON:  Chest radiograph and chest CT Apr 06, 2018 FINDINGS: There is a small left pleural effusion. There is an area of scarring in the lateral right base. There is no edema or consolidation. Heart size is upper normal with pulmonary vascularity normal. No adenopathy evident. Bones are osteoporotic. Bones are osteoporotic with advanced arthropathy in the shoulders bilaterally. There is degenerative change throughout the thoracic spine as well. There is anterior wedging of the T12 vertebral body, stable. IMPRESSION: Small left pleural effusion. Mild scarring lateral right base. No edema or consolidation. Stable cardiac silhouette. No adenopathy. Advanced arthropathy noted in both shoulders. Bones are osteoporotic. Electronically Signed   By: Lowella Grip III M.D.   On: 05/03/2018 14:08   Ct Head Wo  Contrast  Result Date: 05/03/2018 CLINICAL DATA:  84 year old female with altered mental status. Initial encounter. EXAM: CT HEAD WITHOUT CONTRAST TECHNIQUE: Contiguous axial images were obtained from the base of the skull through the vertex without intravenous contrast. COMPARISON:  12/23/2016 CT. FINDINGS: Brain: No intracranial hemorrhage or CT evidence of large acute infarct. Prominent chronic microvascular changes.  Global atrophy. Ventricular prominence unchanged and felt to be related to atrophy rather than hydrocephalus. No intracranial mass lesion noted on this unenhanced exam. Vascular: Vascular calcifications. Skull: Negative Sinuses/Orbits: No acute orbital abnormality. Visualized paranasal sinuses are clear. Other: Mastoid air cells and middle ear cavities are clear. IMPRESSION: No acute intracranial abnormality. Prominent chronic microvascular changes. Atrophy. Electronically Signed   By: Genia Del M.D.   On: 05/03/2018 14:49    Assessment/Plan Delayla was seen today for acute visit.  Diagnoses and all orders for this visit:  Clostridium difficile enteritis  Leg pain, lateral, right    Send stool for C-Diff  Metronidazole 500 mg po TID x 10 days  Retest stool at completion of therapy  Risa-Bid 250 1 tablet po BID  Contact/Enteric Precautions until stool negative for C-Diff  Private Room  Capsacin cream 0.025% liberal amount to RLE TID prn  Continue Tylenol 650 mg po Q 6 hours prn pain  Nursing to trim toenails  Family/ staff Communication:   Total Time:  Documentation:  Face to Face:  Family/Phone:   Labs/tests ordered:  C-diff testing  Medication list reviewed and assessed for continued appropriateness.  Vikki Ports, NP-C [facility] [phone], [place] [address] Cell Phone (Mon-Fri 8am-5pm):  [phone] On Call:  [phone] & follow prompts after 5pm & weekends Office Phone:   [phone] Office Fax:  [phone]

## 2018-05-30 ENCOUNTER — Encounter
Admission: RE | Admit: 2018-05-30 | Discharge: 2018-05-30 | Disposition: A | Discharge status: Home / Self Care | Payer: Medicare Other | Source: Ambulatory Visit | Attending: Internal Medicine | Admitting: Internal Medicine

## 2018-05-30 DIAGNOSIS — E785 Hyperlipidemia, unspecified: Secondary | ICD-10-CM | POA: Insufficient documentation

## 2018-06-13 ENCOUNTER — Inpatient Hospital Stay: Admission: RE | Admit: 2018-06-13 | Payer: Self-pay | Source: Skilled Nursing Facility | Admitting: *Deleted

## 2018-06-13 ENCOUNTER — Other Ambulatory Visit
Admission: RE | Admit: 2018-06-13 | Discharge: 2018-06-13 | Disposition: A | Discharge status: Home / Self Care | Payer: Medicare Other | Source: Skilled Nursing Facility | Attending: Gerontology | Admitting: Gerontology

## 2018-06-13 DIAGNOSIS — Z09 Encounter for follow-up examination after completed treatment for conditions other than malignant neoplasm: Secondary | ICD-10-CM | POA: Insufficient documentation

## 2018-06-13 LAB — C DIFFICILE QUICK SCREEN W PCR REFLEX
C Diff antigen: NEGATIVE
C Diff interpretation: NOT DETECTED
C Diff toxin: NEGATIVE

## 2018-06-14 ENCOUNTER — Other Ambulatory Visit
Admission: RE | Admit: 2018-06-14 | Discharge: 2018-06-14 | Disposition: A | Discharge status: Home / Self Care | Payer: Medicare Other | Source: Ambulatory Visit | Attending: Internal Medicine | Admitting: Internal Medicine

## 2018-06-14 DIAGNOSIS — G301 Alzheimer's disease with late onset: Secondary | ICD-10-CM | POA: Diagnosis present

## 2018-06-14 LAB — C DIFFICILE QUICK SCREEN W PCR REFLEX
C DIFFICILE (CDIFF) TOXIN: NEGATIVE
C DIFFICLE (CDIFF) ANTIGEN: POSITIVE — AB

## 2018-06-14 LAB — CLOSTRIDIUM DIFFICILE BY PCR, REFLEXED

## 2018-06-16 ENCOUNTER — Encounter: Payer: Self-pay | Admitting: Adult Health

## 2018-06-16 ENCOUNTER — Other Ambulatory Visit
Admission: RE | Admit: 2018-06-16 | Discharge: 2018-06-16 | Disposition: A | Discharge status: Home / Self Care | Payer: Medicare Other | Source: Ambulatory Visit | Attending: Internal Medicine | Admitting: Internal Medicine

## 2018-06-16 ENCOUNTER — Non-Acute Institutional Stay (SKILLED_NURSING_FACILITY): Payer: Medicare Other | Admitting: Adult Health

## 2018-06-16 DIAGNOSIS — I2699 Other pulmonary embolism without acute cor pulmonale: Secondary | ICD-10-CM | POA: Diagnosis not present

## 2018-06-16 DIAGNOSIS — A0472 Enterocolitis due to Clostridium difficile, not specified as recurrent: Secondary | ICD-10-CM

## 2018-06-16 DIAGNOSIS — F0150 Vascular dementia without behavioral disturbance: Secondary | ICD-10-CM

## 2018-06-16 DIAGNOSIS — M159 Polyosteoarthritis, unspecified: Secondary | ICD-10-CM

## 2018-06-16 DIAGNOSIS — I5032 Chronic diastolic (congestive) heart failure: Secondary | ICD-10-CM | POA: Diagnosis not present

## 2018-06-16 DIAGNOSIS — E785 Hyperlipidemia, unspecified: Secondary | ICD-10-CM

## 2018-06-16 DIAGNOSIS — M810 Age-related osteoporosis without current pathological fracture: Secondary | ICD-10-CM

## 2018-06-16 LAB — C DIFFICILE QUICK SCREEN W PCR REFLEX
C Diff antigen: NEGATIVE
C Diff interpretation: NOT DETECTED
C Diff toxin: NEGATIVE

## 2018-06-16 NOTE — Progress Notes (Signed)
[facility]   [facility] Room Number: [id number] Place of Service:  SNF (31)   CODE STATUS: Full Code  No Known Allergies  Chief Complaint  Patient presents with  . Medical Management of Chronic Issues    PE: diastolic heart failure; c-diff.     HPI:  She is a 84 year old long term resident of this facility being seen for the management of her chronic illnesses: PE: chf; c-diff. She is unable to participate in the hpi or ros. There are no reports of shortness of breath; no diarrhea; no fevers. There are no nursing concerns at this time.   Past Medical History:  Diagnosis Date  . Arrhythmia   . Arthritis   . Breast cancer [facility])    s/p mastectomy  . Cancer ([place])    breast  . Chronic kidney disease    stones  . Dementia   . Diastolic dysfunction    echo 11/07. EF 60-65%. mild to moderate PI  . GERD (gastroesophageal reflux disease)   . HTN (hypertension)   . Hyperlipidemia   . Hypertension   . Memory loss   . Osteoarthritis (arthritis due to wear and tear of joints)   . Osteoporosis, post-menopausal   . Palpitations    monitor with PACs/PVCs. neg myoview 2007  . Syncope and collapse     Past Surgical History:  Procedure Laterality Date  . hysterectomy-unspecified area    . masectomy     R    Social History   Socioeconomic History  . Marital status: Widowed    Spouse name: Not on file  . Number of children: 3  . Years of education: 41  . Highest education level: High school graduate  Occupational History  . Not on file  Social Needs  . Financial resource strain: Not on file  . Food insecurity:    Worry: Not on file    Inability: Not on file  . Transportation needs:    Medical: Not on file    Non-medical: Not on file  Tobacco Use  . Smoking status: Never Smoker  . Smokeless tobacco: Never Used  . Tobacco comment: tobacco use -no  Substance and Sexual Activity  . Alcohol use: No  . Drug use: No  . Sexual activity: Not on file   Lifestyle  . Physical activity:    Days per week: Not on file    Minutes per session: Not on file  . Stress: Not on file  Relationships  . Social connections:    Talks on phone: Not on file    Gets together: Not on file    Attends religious service: Not on file    Active member of club or organization: Not on file    Attends meetings of clubs or organizations: Not on file    Relationship status: Not on file  . Intimate partner violence:    Fear of current or ex partner: Not on file    Emotionally abused: Not on file    Physically abused: Not on file    Forced sexual activity: Not on file  Other Topics Concern  . Not on file  Social History Narrative   Widowed, retired, gets regular exercise-works in yard.   Family History  Problem Relation Age of Onset  . Breast cancer Mother   . Diabetes type II Mother   . Hypertension Mother   . Heart failure Father   . Coronary artery disease Father   . Heart  disease Brother       VITAL SIGNS BP 110/65   Pulse 67   Temp 98 F (36.7 C)   Resp 16   Ht 4\' 11"  (1.499 m)   Wt 125 lb 6.4 oz (56.9 kg)   SpO2 95%   BMI 25.33 kg/m   Outpatient Encounter Medications as of 06/16/2018  Medication Sig  . acetaminophen (TYLENOL) 325 MG tablet Take 650 mg by mouth every 6 (six) hours as needed for mild pain or fever. for pain/ increased temp. May be administered orally, per G-tube if needed or rectally if unable to swallow (separate order). Maximum dose for 24 hours is 3,000 mg from all sources of Acetaminophen/ Tylenol  . alendronate (FOSAMAX) 70 MG tablet Take 70 mg by mouth every Monday. Take with a full glass of water on an empty stomach.   Marland Kitchen apixaban (ELIQUIS) 5 MG TABS tablet Take 5 mg by mouth 2 (two) times daily.  . calcium citrate-vitamin D (CITRACAL+D) 315-200 MG-UNIT tablet Take 1 tablet by mouth daily.  . capsaicin (ZOSTRIX) 0.025 % cream Apply topically 3 (three) times daily. Apply liberal amount to (R) lower leg (between knee  and ankle) or any other area of pain TID  Prn for pain/ache  . diltiazem (DILACOR XR) 240 MG 24 hr capsule Take 240 mg by mouth daily.  Marland Kitchen donepezil (ARICEPT) 10 MG tablet Take 10 mg by mouth every evening.   . Infant Care Products (DERMACLOUD) CREA Apply topically. Apply liberal amount to area of skin irration as needed. May leave at bedside per MD  . Melatonin 3 MG TABS Take 1 tablet by mouth at bedtime.  . memantine (NAMENDA) 10 MG tablet Take 10 mg by mouth 2 (two) times daily.    . mirtazapine (REMERON) 15 MG tablet Take 7.5 mg by mouth at bedtime. 7.5 =1/2 tablet  . Multiple Vitamins-Minerals (COMPLETE WOMENS) TABS Take 1 tablet by mouth daily.    . NON FORMULARY Diet: Regular  . Nutritional Supplements (ENSURE ENLIVE PO) Take 1 Bottle by mouth 2 (two) times daily between meals.  . pravastatin (PRAVACHOL) 40 MG tablet Take 40 mg by mouth every evening.   . Probiotic Product (RISA-BID PROBIOTIC) TABS Take 1 tablet by mouth 2 (two) times daily. For gut health  . senna-docusate (SENOKOT-S) 8.6-50 MG tablet Take 1 tablet by mouth at bedtime as needed for mild constipation.  . [DISCONTINUED] traZODone (DESYREL) 50 MG tablet Take 50 mg by mouth at bedtime as needed for sleep.   No facility-administered encounter medications on file as of 06/16/2018.      SIGNIFICANT DIAGNOSTIC EXAMS  LABS REVIEWED: TODAY:   05-04-18: wbc 12.5; hgb 12.1; hct 35.7; mcv 90.5; plt 280;  05-05-18: glucose 84; bun 11; creat 0.76; k+ 3.6; na++ 138; ca 7.8  06-16-18: stool for c-diff: neg 06-17-18: chol 150; ldl 87; trig 123; hdl 38    Review of Systems  Unable to perform ROS: Dementia (unable to participate)    Physical Exam  Constitutional: She appears well-developed and well-nourished. No distress.  Thin   Neck: No thyromegaly present.  Cardiovascular: Normal rate, regular rhythm, normal heart sounds and intact distal pulses.  Pulmonary/Chest: Effort normal and breath sounds normal. No respiratory distress.    Status post right mastectomy   Abdominal: Soft. Bowel sounds are normal. She exhibits no distension. There is no tenderness.  Musculoskeletal: She exhibits no edema.  Able to move all extremities   Lymphadenopathy:    She has no cervical  adenopathy.  Neurological: She is alert.  Skin: Skin is warm and dry. She is not diaphoretic.  Psychiatric: She has a normal mood and affect.    ASSESSMENT/ PLAN:  TODAY:   1. Pulmonary emboli: Is stable; will continue eliquis 5 mg twice daily   2. Chronic diastolic heart failure: is stable will continue diltiazem er 240 mg daily   3. Clostridium diffiicle enteritis: is currently negative; is on long term probiotic   4. Dyslipidemia: is stable will continue pravachol 40 mg daily   5. Vascular dementia without behavioral disturbance: is without changes will continue aricept 10 mg daily and namenda 10 mg twice daily is taking remeron 7.5 mg daily for appetite   6. Post-menopausal osteoporosis: stable will continue fosamax 70 mg weekly; is on calcium supplement  7. Osteoarthritis involving multiple joints on both sides of body: is stable will continue to zostrix to right leg three times daily   Will check lipids   MD is aware of resident's narcotic use and is in agreement with current plan of care. We will attempt to wean resident as apropriate   Ok Edwards NP [facility]  Contact [phone] Monday through Friday 8am- 5pm  After hours call [phone]

## 2018-06-17 ENCOUNTER — Other Ambulatory Visit
Admission: RE | Admit: 2018-06-17 | Discharge: 2018-06-17 | Disposition: A | Discharge status: Home / Self Care | Payer: Medicare Other | Source: Ambulatory Visit | Attending: Internal Medicine | Admitting: Internal Medicine

## 2018-06-17 DIAGNOSIS — E785 Hyperlipidemia, unspecified: Secondary | ICD-10-CM | POA: Diagnosis present

## 2018-06-17 LAB — LIPID PANEL
CHOL/HDL RATIO: 3.9 ratio
Cholesterol: 150 mg/dL (ref 0–200)
HDL: 38 mg/dL — AB (ref >40)
LDL CALC: 87 mg/dL (ref 0–99)
Triglycerides: 123 mg/dL (ref <150)
VLDL: 25 mg/dL (ref 0–40)

## 2018-06-26 DIAGNOSIS — E785 Hyperlipidemia, unspecified: Secondary | ICD-10-CM | POA: Insufficient documentation

## 2018-06-26 DIAGNOSIS — M810 Age-related osteoporosis without current pathological fracture: Secondary | ICD-10-CM | POA: Insufficient documentation

## 2018-06-26 DIAGNOSIS — F0150 Vascular dementia without behavioral disturbance: Secondary | ICD-10-CM | POA: Insufficient documentation

## 2018-06-30 ENCOUNTER — Encounter
Admission: RE | Admit: 2018-06-30 | Discharge: 2018-06-30 | Disposition: A | Discharge status: Home / Self Care | Payer: Medicare Other | Source: Ambulatory Visit | Attending: Internal Medicine | Admitting: Internal Medicine

## 2018-06-30 DIAGNOSIS — E785 Hyperlipidemia, unspecified: Secondary | ICD-10-CM | POA: Insufficient documentation

## 2018-07-28 ENCOUNTER — Non-Acute Institutional Stay (SKILLED_NURSING_FACILITY): Payer: Medicare Other | Admitting: Adult Health

## 2018-07-28 ENCOUNTER — Encounter: Payer: Self-pay | Admitting: Adult Health

## 2018-07-28 DIAGNOSIS — E785 Hyperlipidemia, unspecified: Secondary | ICD-10-CM

## 2018-07-28 DIAGNOSIS — I10 Essential (primary) hypertension: Secondary | ICD-10-CM | POA: Diagnosis not present

## 2018-07-28 DIAGNOSIS — Z86711 Personal history of pulmonary embolism: Secondary | ICD-10-CM

## 2018-07-28 DIAGNOSIS — I499 Cardiac arrhythmia, unspecified: Secondary | ICD-10-CM

## 2018-07-28 DIAGNOSIS — F0150 Vascular dementia without behavioral disturbance: Secondary | ICD-10-CM

## 2018-07-28 DIAGNOSIS — R630 Anorexia: Secondary | ICD-10-CM

## 2018-07-28 NOTE — Progress Notes (Signed)
[facility]:  [facility] Room Number: [place] of Service:  SNF ([phone]) Provider:  Durenda Age, NP  Patient Care Team: [name], MD as PCP - General (Family Medicine)  Extended Emergency Contact Information Primary Emergency Contact: [name] Address: [place], [place] Phone: [phone] Work Phone: [phone] Relation: Daughter Secondary Emergency Contact: Carsen, Machi Work Phone: [phone] Mobile Phone: [phone] Relation: Son  Code Status:  FULL CODE  Goals of care: Advanced Directive information Advanced Directives 07/28/2018  Does Patient Have a Medical Advance Directive? No  Type of Advance Directive -  Does patient want to make changes to medical advance directive? -  Copy of [place] in Chart? -  Would patient like information on creating a medical advance directive? No - Patient declined     Chief Complaint  Patient presents with  . Medical Management of Chronic Issues    Routine Visit    HPI:  Pt is an 84 y.o. female seen today for medical management of chronic diseases.  She is a long-term care resident of [facility]  She has a PMH of breast cancer, CKD, dementia, diastolic dysfunction, GERD, HLD, HTN, and osteoarthritis. She denies palpitations.    Past Medical History:  Diagnosis Date  . Arrhythmia   . Arthritis   . Breast cancer [facility])    s/p mastectomy  . Cancer ([place])    breast  . Chronic kidney disease    stones  . Dementia   . Diastolic dysfunction    echo 11/07. EF 60-65%. mild to moderate PI  . GERD (gastroesophageal reflux disease)   . HTN (hypertension)   . Hyperlipidemia   . Hypertension   . Memory loss   . Osteoarthritis (arthritis due to wear and tear of joints)   . Osteoporosis, post-menopausal   . Palpitations    monitor with PACs/PVCs. neg myoview 2007  . Syncope and collapse    Past Surgical History:   Procedure Laterality Date  . hysterectomy-unspecified area    . masectomy     R    No Known Allergies  Outpatient Encounter Medications as of 07/28/2018  Medication Sig  . acetaminophen (TYLENOL) 325 MG tablet Take 650 mg by mouth every 6 (six) hours as needed for mild pain or fever. for pain/ increased temp. May be administered orally, per G-tube if needed or rectally if unable to swallow (separate order). Maximum dose for 24 hours is 3,000 mg from all sources of Acetaminophen/ Tylenol  . acetaminophen (TYLENOL) 325 MG tablet Take 650 mg by mouth 2 (two) times daily.  Marland Kitchen alendronate (FOSAMAX) 70 MG tablet Take 70 mg by mouth every Monday. Take with a full glass of water on an empty stomach.   Marland Kitchen apixaban (ELIQUIS) 5 MG TABS tablet Take 5 mg by mouth 2 (two) times daily.  . calcium citrate-vitamin D (CITRACAL+D) 315-200 MG-UNIT tablet Take 1 tablet by mouth daily.  . capsaicin (ZOSTRIX) 0.025 % cream Apply liberal amount topically to  (R) lower leg (between knee and ankle) or any other area of pain TID  Prn for pain/ache  . diltiazem (DILACOR XR) 240 MG 24 hr capsule Take 240 mg by mouth daily. for arrhythmia, HTN; DO NOT CRUSH, SWALLOW WHOLE  . donepezil (ARICEPT) 10 MG tablet Take 10 mg by mouth every evening.   . Infant Care Products (DERMACLOUD) CREA Apply topically. Apply liberal amount topically  to area of skin irration as needed. May leave at bedside per MD  . Melatonin 3 MG TABS Take 1 tablet by mouth at bedtime.   . memantine (NAMENDA) 10 MG tablet Take 10 mg by mouth 2 (two) times daily.   . mirtazapine (REMERON) 15 MG tablet Take 7.5 mg by mouth at bedtime. 7.5 =1/2 tablet  . Multiple Vitamins-Minerals (COMPLETE WOMENS) TABS Take 1 tablet by mouth daily.    . NON FORMULARY Diet: Regular  . Nutritional Supplements (ENSURE ENLIVE PO) Take 1 Bottle by mouth 2 (two) times daily between meals.  . pravastatin (PRAVACHOL) 40 MG tablet Take 40 mg by mouth every evening.   . Probiotic  Product (RISA-BID PROBIOTIC) TABS Take 1 tablet by mouth 2 (two) times daily. For gut health  . senna-docusate (SENOKOT-S) 8.6-50 MG tablet Take 1 tablet by mouth at bedtime as needed for mild constipation.   No facility-administered encounter medications on file as of 07/28/2018.     Review of Systems  Unable to obtain due to dementia    Immunization History  Administered Date(s) Administered  . Influenza,inj,Quad PF,6+ Mos 09/21/2017  . Influenza-Unspecified 11/13/2015  . Pneumococcal Conjugate-13 11/11/2013  . Pneumococcal Polysaccharide-23 04/07/2018   Pertinent  Health Maintenance Due  Topic Date Due  . DEXA SCAN  03/14/1994  . INFLUENZA VACCINE  06/30/2018  . PNA vac Low Risk Adult  Completed     Vitals:   07/28/18 1122  BP: 112/70  Pulse: 74  Resp: 16  Temp: (!) 96.9 F (36.1 C)  TempSrc: Oral  SpO2: 96%  Height: 4\' 11"  (1.499 m)   Body mass index is 25.33 kg/m.  Physical Exam  GENERAL APPEARANCE: Well nourished. In no acute distress. Normal body habitus SKIN:  Skin is warm and dry.  MOUTH and THROAT: Lips are without lesions. Oral mucosa is moist and without lesions.  RESPIRATORY: Breathing is even & unlabored, BS CTAB CARDIAC: RRR, no murmur,no extra heart sounds, no edema GI: Abdomen soft, normal BS, no masses, no tenderness EXTREMITIES: Able to move X 4 extremities PSYCHIATRIC: Alert to self, disoriented to time and place. Affect and behavior are appropriate   Labs reviewed: Recent Labs    05/03/18 1312 05/04/18 0356 05/05/18 0558  NA 136 139 138  K 4.4 4.0 3.6  CL 101 108 109  CO2 25 23 24   GLUCOSE 167* 89 84  BUN 13 16 11   CREATININE 1.24* 0.94 0.76  CALCIUM 9.0 8.2* 7.8*   Recent Labs    04/06/18 0946 04/11/18 0400 05/03/18 1312  AST 26 22 25   ALT 19 16 15   ALKPHOS 56 56 54  BILITOT 0.9 0.6 1.2  PROT 6.8 6.6 6.6  ALBUMIN 3.1* 3.0* 3.5   Recent Labs    04/06/18 0946  04/11/18 0400 05/03/18 1312 05/04/18 0356  WBC 18.2*    < > 11.4* 18.1* 12.3*  NEUTROABS 15.4*  --  8.4*  --  9.0*  HGB 14.0   < > 13.3 13.4 12.1  HCT 41.3   < > 39.8 40.9 35.7  MCV 92.6   < > 92.9 90.9 90.5  PLT 292   < > 355 299 280   < > = values in this interval not displayed.   Lab Results  Component Value Date   TSH 1.439 05/03/2018    Lab Results  Component Value Date   CHOL 150 06/17/2018   HDL 38 (L) 06/17/2018   LDLCALC 87 06/17/2018   TRIG 123 06/17/2018  CHOLHDL 3.9 06/17/2018    Assessment/Plan  1. HYPERTENSION, BENIGN - well controlled, continue diltiazem24-hour 240 mg 1 capsule daily   2. Dyslipidemia - continue ravastatin 40 mg 1 tab daily at bedtime   3. History of pulmonary embolism - no SOB, continue Eliquis 5 mg 1 tablet twice a day  4. Cardiac arrhythmia, unspecified cardiac arrhythmia type - rate controlled, continue diltiazem 24-hour 240 mg 1 capsule daily   5. Poor appetite - Body mass index is 28.74 kg/m.  continue mirtazapine15 mg 1/2 tab= 7.5 mg Q HS Last Weight  Most recent update: 07/29/2018  3:10 PM   Weight  64.5 kg (142 lb 4.8 oz)            6. Vascular dementia without behavioral disturbance - continue onepezil 10 mg 1 tab daily at bedtime memantine 10 mg 1 tab twice a day supportive care, fall precautions     Family/ staff Communication: Discussed plan of care with resident .  Labs/tests ordered:  n Goals of care:   Long term care   Durenda Age, NP [facility] [phone] (Monday-Friday 8:00 a.m. - 5:00 p.m.) [phone] (after hours)

## 2018-07-29 ENCOUNTER — Encounter: Payer: Self-pay | Admitting: Adult Health

## 2018-07-31 ENCOUNTER — Encounter
Admission: RE | Admit: 2018-07-31 | Discharge: 2018-07-31 | Disposition: A | Discharge status: Home / Self Care | Payer: Medicare Other | Source: Ambulatory Visit | Attending: Internal Medicine | Admitting: Internal Medicine

## 2018-07-31 DIAGNOSIS — E785 Hyperlipidemia, unspecified: Secondary | ICD-10-CM | POA: Insufficient documentation

## 2018-08-11 ENCOUNTER — Other Ambulatory Visit
Admission: RE | Admit: 2018-08-11 | Discharge: 2018-08-11 | Disposition: A | Discharge status: Home / Self Care | Payer: Medicare Other | Source: Ambulatory Visit | Attending: Adult Health | Admitting: Adult Health

## 2018-08-11 DIAGNOSIS — I5032 Chronic diastolic (congestive) heart failure: Secondary | ICD-10-CM | POA: Insufficient documentation

## 2018-08-11 LAB — BASIC METABOLIC PANEL
Anion gap: 8 (ref 5–15)
BUN: 24 mg/dL — AB (ref 8–23)
CO2: 28 mmol/L (ref 22–32)
CREATININE: 0.92 mg/dL (ref 0.44–1.00)
Calcium: 8.9 mg/dL (ref 8.9–10.3)
Chloride: 106 mmol/L (ref 98–111)
GFR calc Af Amer: >60 mL/min (ref >60)
GFR calc non Af Amer: 54 mL/min — ABNORMAL LOW (ref >60)
Glucose, Bld: 93 mg/dL (ref 70–99)
POTASSIUM: 4.3 mmol/L (ref 3.5–5.1)
Sodium: 142 mmol/L (ref 135–145)

## 2018-08-11 LAB — CBC WITH DIFFERENTIAL/PLATELET
Basophils Absolute: 0.1 10*3/uL (ref 0–0.1)
Basophils Relative: 1 %
Eosinophils Absolute: 0.3 10*3/uL (ref 0–0.7)
Eosinophils Relative: 4 %
HEMATOCRIT: 37.6 % (ref 35.0–47.0)
HEMOGLOBIN: 12.7 g/dL (ref 12.0–16.0)
LYMPHS ABS: 2.1 10*3/uL (ref 1.0–3.6)
LYMPHS PCT: 27 %
MCH: 30.8 pg (ref 26.0–34.0)
MCHC: 33.8 g/dL (ref 32.0–36.0)
MCV: 91.1 fL (ref 80.0–100.0)
MONOS PCT: 8 %
Monocytes Absolute: 0.6 10*3/uL (ref 0.2–0.9)
NEUTROS ABS: 4.7 10*3/uL (ref 1.4–6.5)
NEUTROS PCT: 60 %
Platelets: 243 10*3/uL (ref 150–440)
RBC: 4.12 MIL/uL (ref 3.80–5.20)
RDW: 16.8 % — ABNORMAL HIGH (ref 11.5–14.5)
WBC: 7.9 10*3/uL (ref 3.6–11.0)

## 2018-08-12 ENCOUNTER — Other Ambulatory Visit
Admission: RE | Admit: 2018-08-12 | Discharge: 2018-08-12 | Disposition: A | Discharge status: Home / Self Care | Payer: Medicare Other | Source: Ambulatory Visit | Attending: Internal Medicine | Admitting: Internal Medicine

## 2018-08-12 DIAGNOSIS — R319 Hematuria, unspecified: Secondary | ICD-10-CM | POA: Diagnosis present

## 2018-08-12 LAB — URINALYSIS, COMPLETE (UACMP) WITH MICROSCOPIC
Bilirubin Urine: NEGATIVE
GLUCOSE, UA: NEGATIVE mg/dL
Hgb urine dipstick: NEGATIVE
Ketones, ur: NEGATIVE mg/dL
Nitrite: NEGATIVE
PH: 6 (ref 5.0–8.0)
Protein, ur: NEGATIVE mg/dL
Specific Gravity, Urine: 1.019 (ref 1.005–1.030)

## 2018-08-14 LAB — URINE CULTURE

## 2018-08-18 DIAGNOSIS — I499 Cardiac arrhythmia, unspecified: Secondary | ICD-10-CM | POA: Insufficient documentation

## 2018-08-29 ENCOUNTER — Encounter: Payer: Self-pay | Admitting: Adult Health

## 2018-08-29 ENCOUNTER — Non-Acute Institutional Stay (SKILLED_NURSING_FACILITY): Payer: Medicare Other | Admitting: Adult Health

## 2018-08-29 DIAGNOSIS — F0150 Vascular dementia without behavioral disturbance: Secondary | ICD-10-CM

## 2018-08-29 DIAGNOSIS — I2699 Other pulmonary embolism without acute cor pulmonale: Secondary | ICD-10-CM

## 2018-08-29 DIAGNOSIS — I5032 Chronic diastolic (congestive) heart failure: Secondary | ICD-10-CM | POA: Diagnosis not present

## 2018-08-29 DIAGNOSIS — E785 Hyperlipidemia, unspecified: Secondary | ICD-10-CM | POA: Diagnosis not present

## 2018-08-29 NOTE — Progress Notes (Signed)
[facility]   [facility] Room Number: [place] of Service:  SNF (31)   CODE STATUS: Full Code  No Known Allergies  Chief Complaint  Patient presents with  . Medical Management of Chronic Issues    PE; diastolic heart failure; vascular dementia; dyslipidemia     HPI:  She is a 84 year old long term resident of this facility being seen for the management of her chronic illnesses: PE; diastolic heart failure; vascular dementia; dyslipidemia. She is unable to participate in the hpi or ros. There are no reports of agitation; no cough or shortness of breath.   Past Medical History:  Diagnosis Date  . Arrhythmia   . Arthritis   . Breast cancer [facility])    s/p mastectomy  . Cancer ([place])    breast  . Chronic kidney disease    stones  . Dementia   . Diastolic dysfunction    echo 11/07. EF 60-65%. mild to moderate PI  . GERD (gastroesophageal reflux disease)   . HTN (hypertension)   . Hyperlipidemia   . Hypertension   . Memory loss   . Osteoarthritis (arthritis due to wear and tear of joints)   . Osteoporosis, post-menopausal   . Palpitations    monitor with PACs/PVCs. neg myoview 2007  . Syncope and collapse     Past Surgical History:  Procedure Laterality Date  . hysterectomy-unspecified area    . masectomy     R    Social History   Socioeconomic History  . Marital status: Widowed    Spouse name: Not on file  . Number of children: 3  . Years of education: 52  . Highest education level: High school graduate  Occupational History  . Not on file  Social Needs  . Financial resource strain: Not on file  . Food insecurity:    Worry: Not on file    Inability: Not on file  . Transportation needs:    Medical: Not on file    Non-medical: Not on file  Tobacco Use  . Smoking status: Never Smoker  . Smokeless tobacco: Never Used  . Tobacco comment: tobacco use -no  Substance and Sexual Activity  . Alcohol use: No  . Drug use: No  .  Sexual activity: Not on file  Lifestyle  . Physical activity:    Days per week: Not on file    Minutes per session: Not on file  . Stress: Not on file  Relationships  . Social connections:    Talks on phone: Not on file    Gets together: Not on file    Attends religious service: Not on file    Active member of club or organization: Not on file    Attends meetings of clubs or organizations: Not on file    Relationship status: Not on file  . Intimate partner violence:    Fear of current or ex partner: Not on file    Emotionally abused: Not on file    Physically abused: Not on file    Forced sexual activity: Not on file  Other Topics Concern  . Not on file  Social History Narrative   Widowed, retired, gets regular exercise-works in yard.   Family History  Problem Relation Age of Onset  . Breast cancer Mother   . Diabetes type II Mother   . Hypertension Mother   . Heart failure Father   . Coronary artery disease Father   . Heart disease  Brother       VITAL SIGNS BP (!) 142/62   Pulse 80   Temp 98.7 F (37.1 C)   Resp 16   Ht 4\' 11"  (1.499 m)   Wt 142 lb 4.8 oz (64.5 kg)   SpO2 97%   BMI 28.74 kg/m   Outpatient Encounter Medications as of 08/29/2018  Medication Sig  . acetaminophen (TYLENOL) 325 MG tablet Take 650 mg by mouth every 6 (six) hours as needed for mild pain or fever. for pain/ increased temp. May be administered orally, per G-tube if needed or rectally if unable to swallow (separate order). Maximum dose for 24 hours is 3,000 mg from all sources of Acetaminophen/ Tylenol  . acetaminophen (TYLENOL) 325 MG tablet Take 650 mg by mouth 2 (two) times daily.  Marland Kitchen alendronate (FOSAMAX) 70 MG tablet Take 70 mg by mouth every Monday. Take with a full glass of water on an empty stomach.   . calcium citrate-vitamin D (CITRACAL+D) 315-200 MG-UNIT tablet Take 1 tablet by mouth daily.  . capsaicin (ZOSTRIX) 0.025 % cream Apply liberal amount topically to  (R) lower leg  (between knee and ankle) or any other area of pain TID  Prn for pain/ache  . Dermatological Products, Misc. (ITCH-ENDER!) LOTN Apply to buttocks every shift  . diltiazem (DILACOR XR) 240 MG 24 hr capsule Take 240 mg by mouth daily. for arrhythmia, HTN; DO NOT CRUSH, SWALLOW WHOLE  . donepezil (ARICEPT) 10 MG tablet Take 10 mg by mouth every evening.   . Infant Care Products (DERMACLOUD) CREA Apply topically. Apply liberal amount topically to area of skin irration as needed. May leave at bedside per MD  . Melatonin 3 MG TABS Take 1 tablet by mouth at bedtime.   . memantine (NAMENDA) 10 MG tablet Take 10 mg by mouth 2 (two) times daily.   . mirtazapine (REMERON) 15 MG tablet Take 7.5 mg by mouth at bedtime. 7.5 =1/2 tablet  . Multiple Vitamins-Minerals (COMPLETE WOMENS) TABS Take 1 tablet by mouth daily.    . NON FORMULARY Diet: Regular  . Nutritional Supplements (ENSURE ENLIVE PO) Take 1 Bottle by mouth 2 (two) times daily between meals.  . pravastatin (PRAVACHOL) 40 MG tablet Take 40 mg by mouth every evening.   . Probiotic Product (RISA-BID PROBIOTIC) TABS Take 1 tablet by mouth 2 (two) times daily. For gut health  . senna-docusate (SENOKOT-S) 8.6-50 MG tablet Take 1 tablet by mouth at bedtime as needed for mild constipation.  . [DISCONTINUED] apixaban (ELIQUIS) 5 MG TABS tablet Take 5 mg by mouth 2 (two) times daily.   No facility-administered encounter medications on file as of 08/29/2018.      SIGNIFICANT DIAGNOSTIC EXAMS  LABS REVIEWED: PREVIOUS:   05-04-18: wbc 12.5; hgb 12.1; hct 35.7; mcv 90.5; plt 280;  05-05-18: glucose 84; bun 11; creat 0.76; k+ 3.6; na++ 138; ca 7.8  06-16-18: stool for c-diff: neg 06-17-18: chol 150; ldl 87; trig 123; hdl 38   TODAY:   08-11-18: wbc 7.9; hgb 12.7; hct 37.6; mcv 91.1; plt 243; glucose 93; bun 24; creat 0.92; k+ 4.2; na++ 142; ca 8.9 urine culture: multiple species    Review of Systems  Unable to perform ROS: Dementia (unable to participate )    Physical Exam  Constitutional: She appears well-developed and well-nourished. No distress.  Thin   Neck: No thyromegaly present.  Cardiovascular: Normal rate, regular rhythm, normal heart sounds and intact distal pulses.  Pulmonary/Chest: Effort normal and breath sounds normal.  No respiratory distress.  Status post right mastectomy   Abdominal: Soft. Bowel sounds are normal. She exhibits no distension. There is no tenderness.  Musculoskeletal: She exhibits no edema.  Is able to move all extremities   Lymphadenopathy:    She has no cervical adenopathy.  Neurological: She is alert.  Skin: Skin is warm and dry. She is not diaphoretic.  Psychiatric: She has a normal mood and affect.   .    ASSESSMENT/ PLAN:  TODAY:   1. Pulmonary emboli: Is stable; will continue eliquis 5 mg twice daily   2. Chronic diastolic heart failure: is stable will continue diltiazem er 240 mg daily   3. Dyslipidemia: is stable will continue pravachol 40 mg daily   4. Vascular dementia without behavioral disturbance: is without change weight is 142 pounds will continue aricept 10 mg daily and namenda 10 mg twice daily is taking remeron 7.5 mg daily for appetite   PREVIOUS   5. Post-menopausal osteoporosis: stable will continue fosamax 70 mg weekly; is on calcium supplement  6. Osteoarthritis involving multiple joints on both sides of body: is stable will continue to zostrix to right leg three times daily      MD is aware of resident's narcotic use and is in agreement with current plan of care. We will attempt to wean resident as apropriate   Ok Edwards NP [facility] Adult [facility]  Contact [phone] Monday through Friday 8am- 5pm  After hours call [phone]

## 2018-08-30 ENCOUNTER — Encounter
Admission: RE | Admit: 2018-08-30 | Discharge: 2018-08-30 | Disposition: A | Discharge status: Home / Self Care | Payer: Medicare Other | Source: Ambulatory Visit | Attending: Internal Medicine | Admitting: Internal Medicine

## 2018-08-30 DIAGNOSIS — E785 Hyperlipidemia, unspecified: Secondary | ICD-10-CM | POA: Insufficient documentation

## 2018-09-30 ENCOUNTER — Encounter: Admission: RE | Admit: 2018-09-30 | Payer: Medicare Other | Source: Ambulatory Visit | Admitting: Internal Medicine

## 2018-09-30 DEATH — deceased

## 2018-10-03 ENCOUNTER — Encounter: Payer: Self-pay | Admitting: Adult Health

## 2018-10-03 NOTE — Progress Notes (Signed)
Entered in error

## 2019-02-11 IMAGING — DX DG ANKLE COMPLETE 3+V*R*
3 series · 3 of 3 positions shown · non-contrast
Comparison: None.

CLINICAL DATA: Status post fall several days ago.  Pain.

EXAM:
RIGHT ANKLE - COMPLETE 3+ VIEW

[ankle ap]
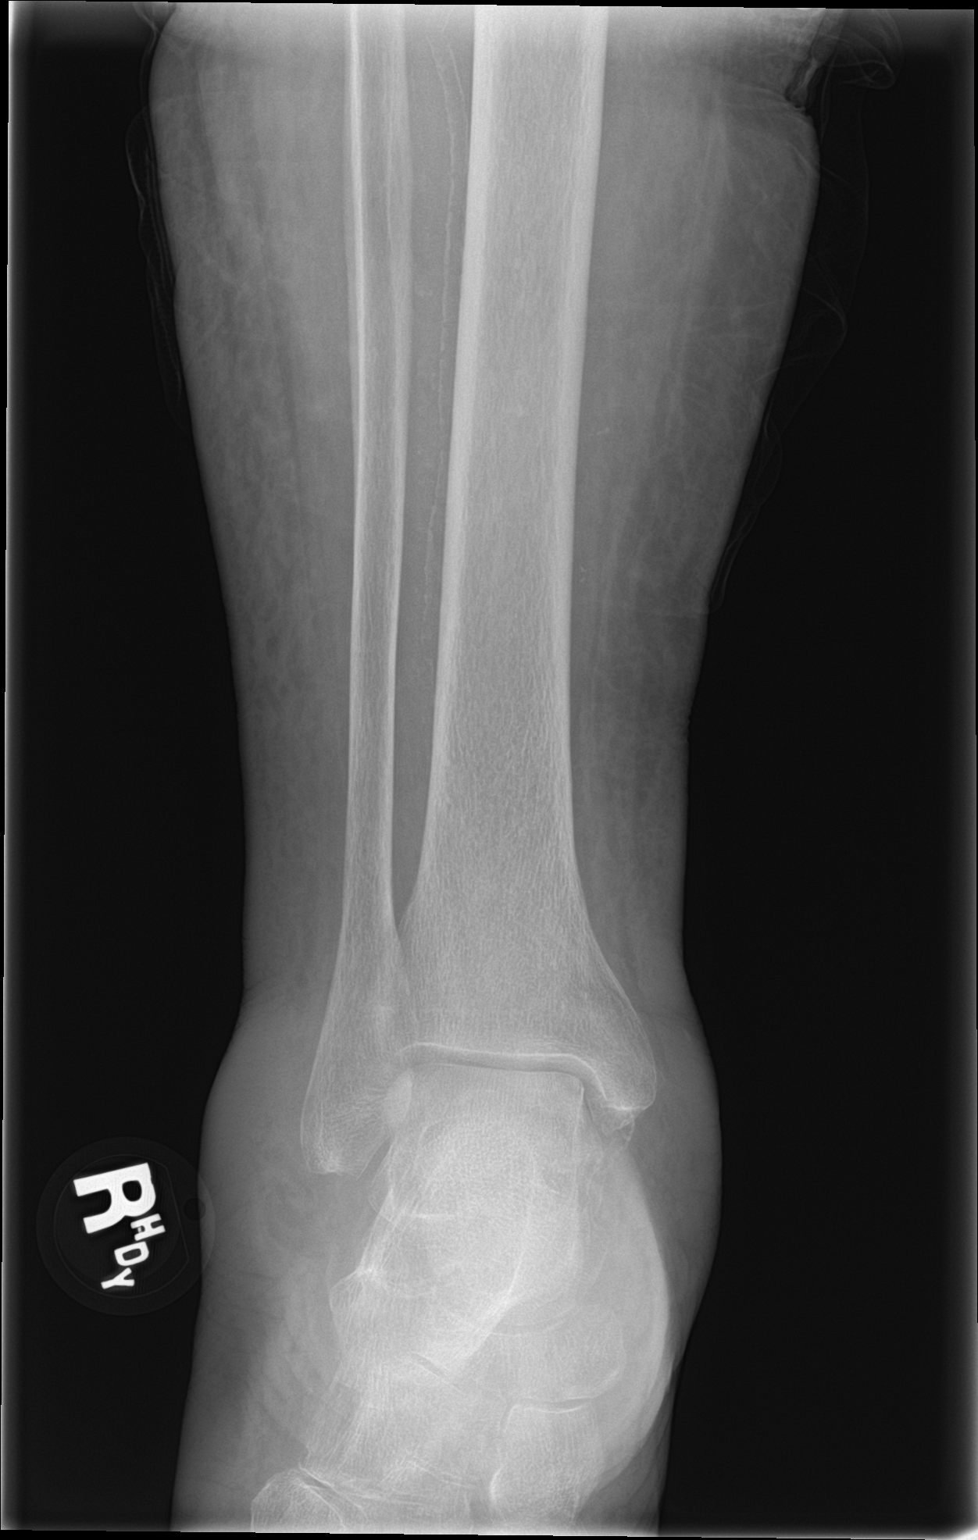

[ankle obl]
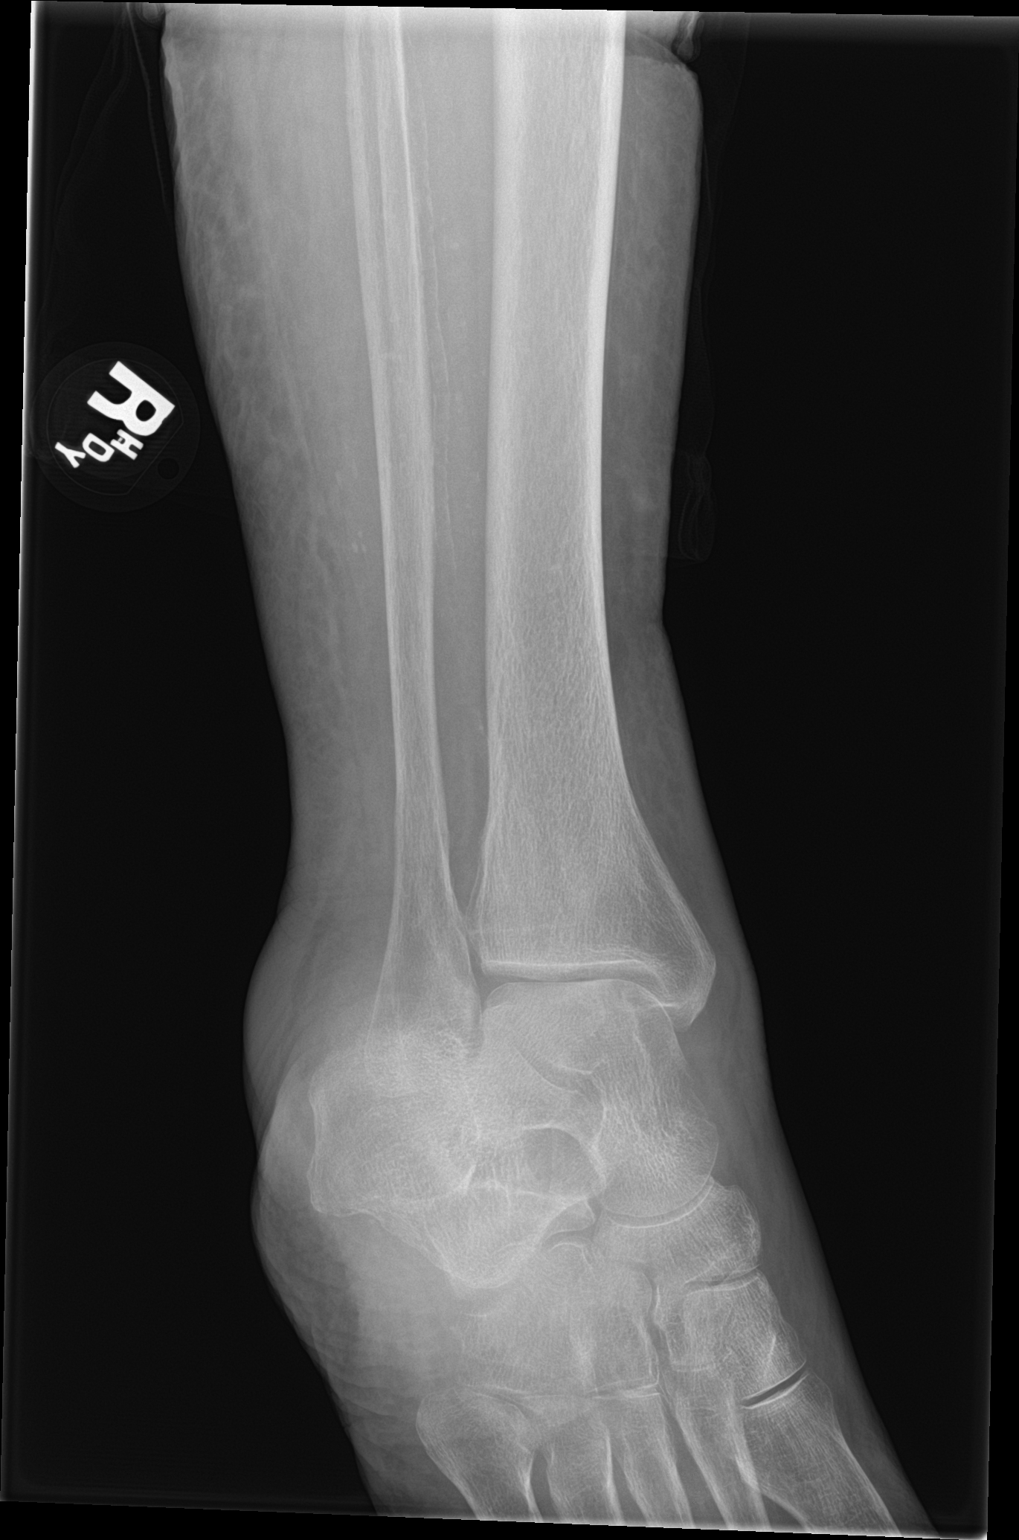

[ankle lat]
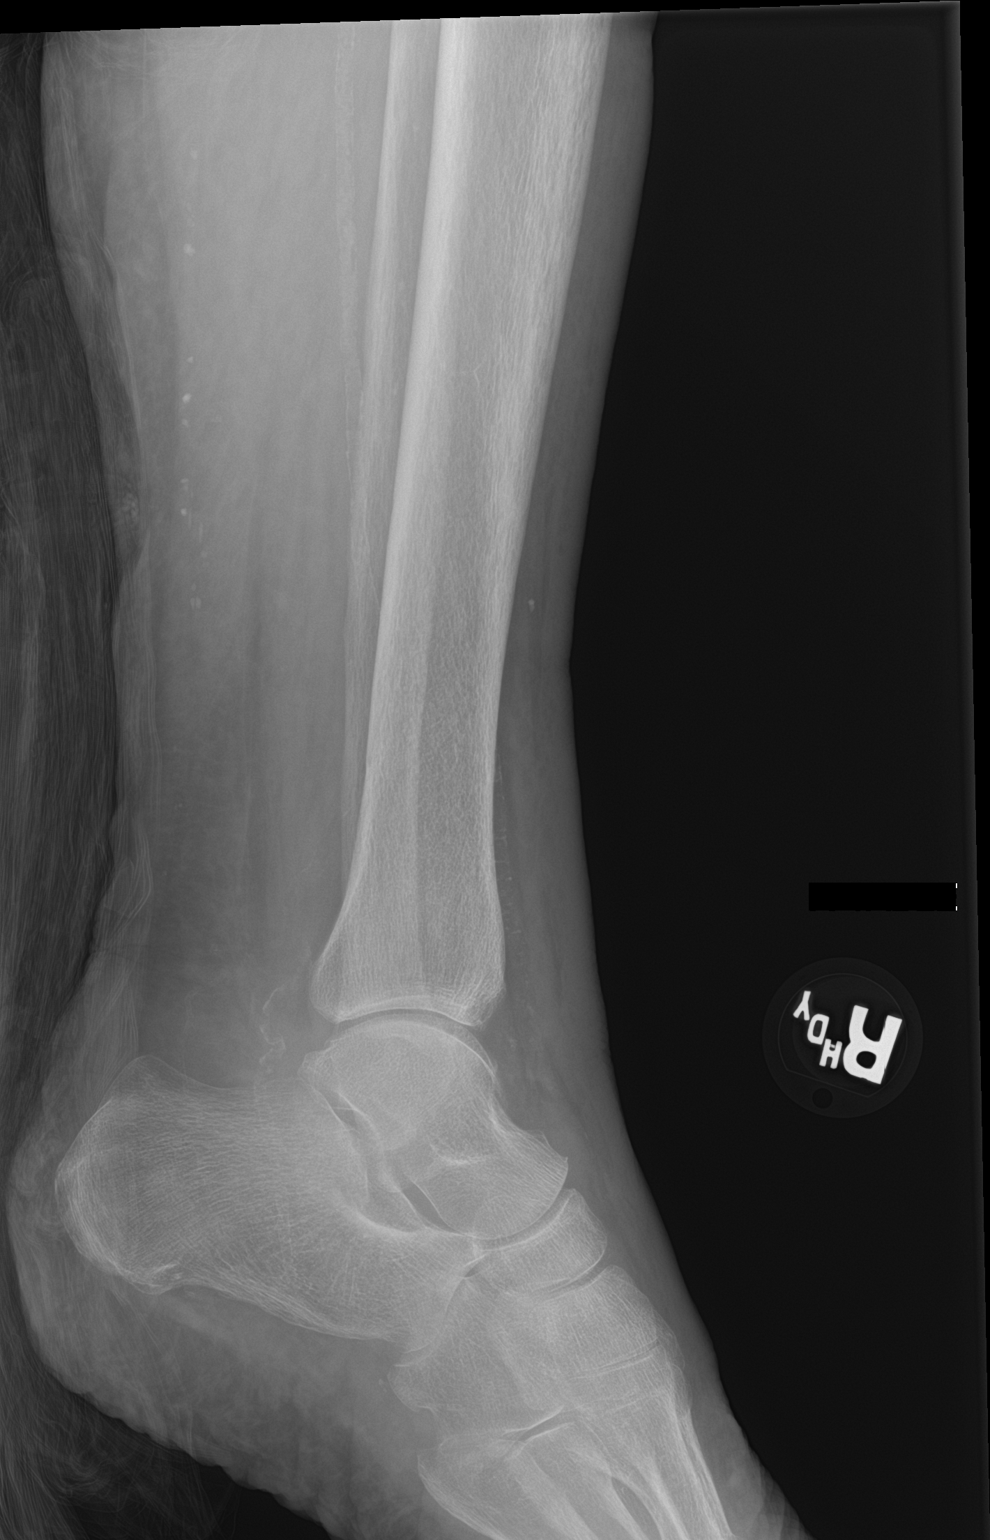

[3 of 3 positions shown; findings below may reference images not displayed]

FINDINGS: Marked soft tissue swelling in the ankle, lateral greater than
medial. Vascular calcifications. No acute fractures are seen. No
other acute abnormalities.
IMPRESSION: Soft tissue swelling.  No identified fracture.

## 2019-02-11 IMAGING — MR MR ANKLE*R* W/O CM
5 series · 40 of 40 positions shown · non-contrast
Comparison: None.

CLINICAL DATA: Right ankle pain after fall on [REDACTED].

EXAM:
MRI OF THE RIGHT ANKLE WITHOUT CONTRAST
TECHNIQUE: Multiplanar, multisequence MR imaging of the ankle was performed. No
intravenous contrast was administered.

[Series 6: PD fat-sat · axial · 3.0mm · 0.56mm/px · z∈[-97,+24]mm · 9 of 35 slices shown]
[im 1/35]
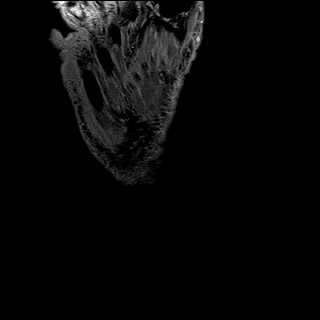
[im 5/35]
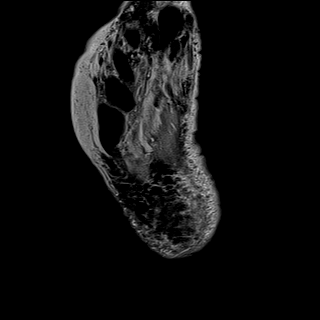
[im 9/35]
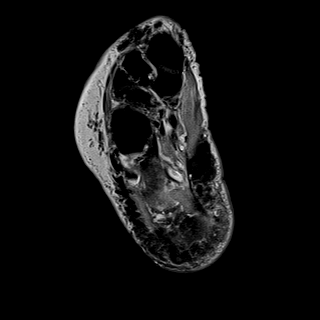
[im 13/35]
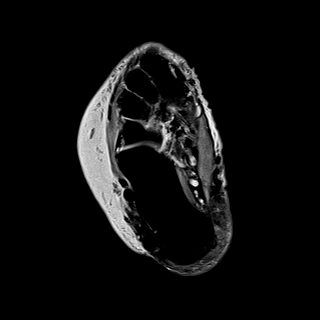
[im 18/35]
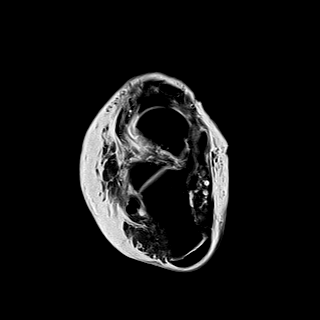
[im 22/35]
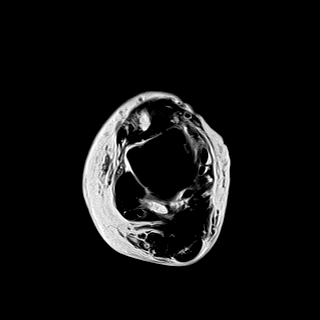
[im 26/35]
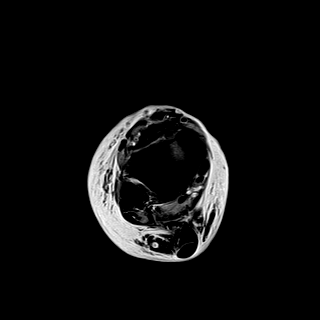
[im 30/35]
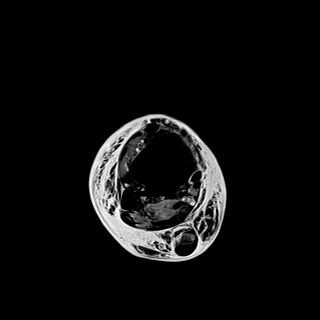
[im 35/35]
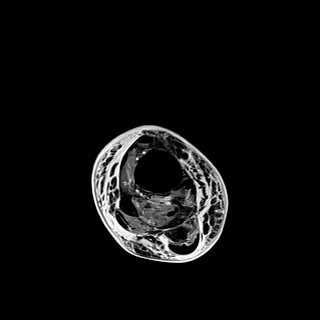

[Series 7: T2 fat-sat · coronal · 3.0mm · 0.56mm/px · 8 of 33 slices shown (1 of 3)]
[im 1/33]
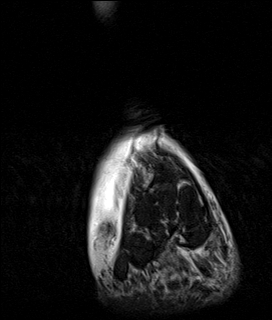
[im 5/33]
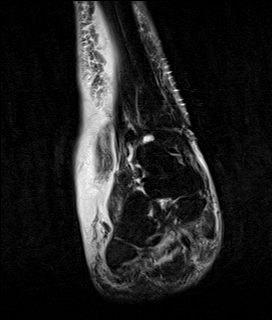
[im 10/33]
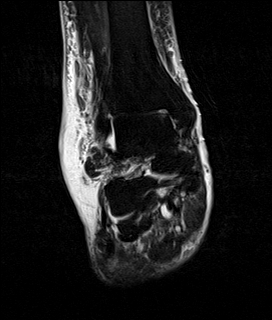
[im 14/33]
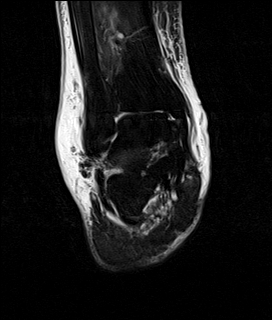
[im 19/33]
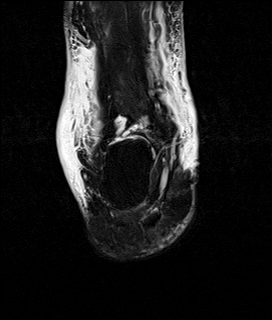
[im 23/33]
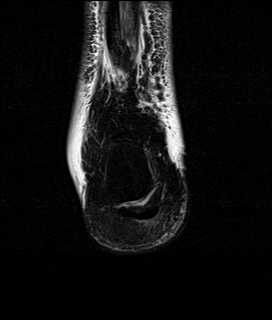
[im 28/33]
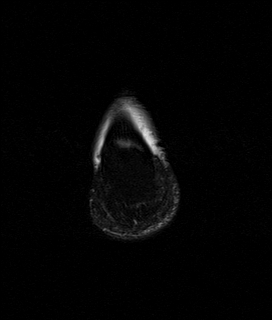
[im 33/33]
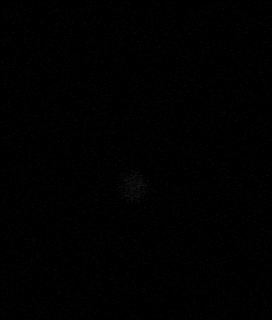

[Series 8: T1 · sagittal · 3.0mm · 0.56mm/px · 7 of 28 slices shown]
[im 1/28]
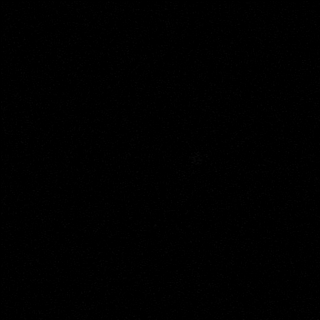
[im 5/28]
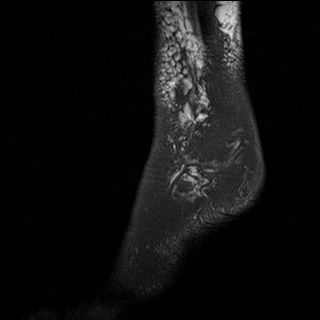
[im 10/28]
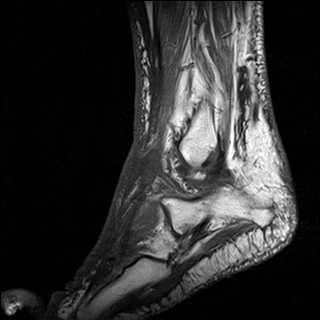
[im 14/28]
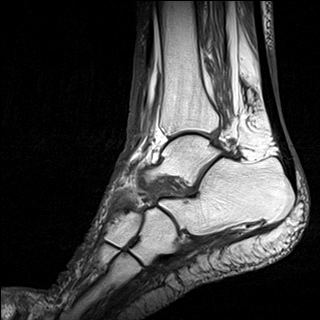
[im 19/28]
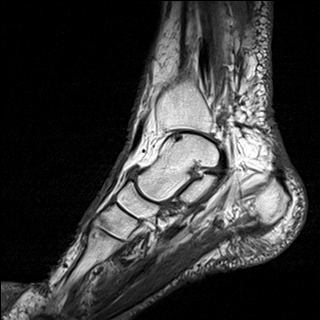
[im 23/28]
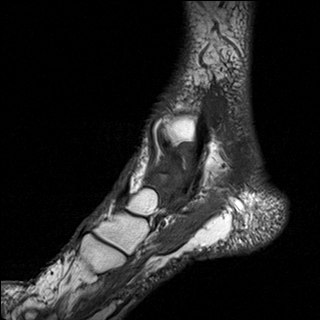
[im 28/28]
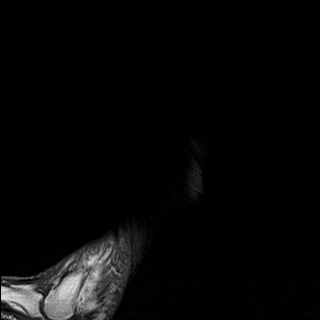

[Series 9: T2 fat-sat · sagittal · 3.0mm · 0.70mm/px · 7 of 28 slices shown (2 of 3)]
[im 1/28]
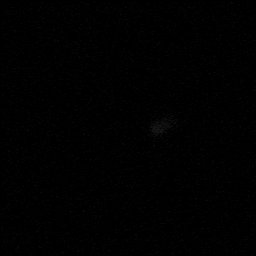
[im 5/28]
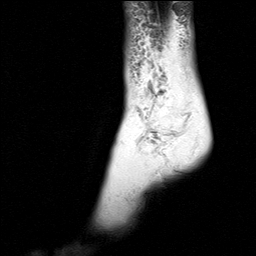
[im 10/28]
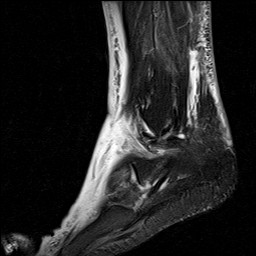
[im 14/28]
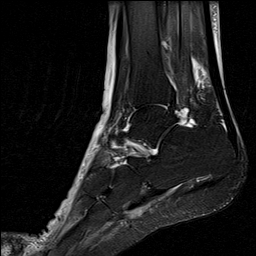
[im 19/28]
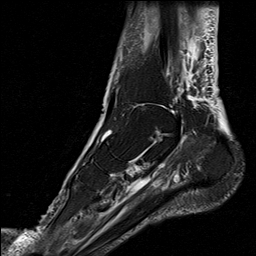
[im 23/28]
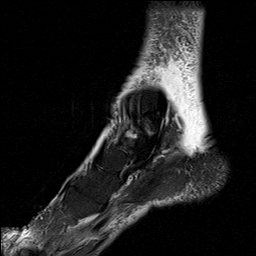
[im 28/28]
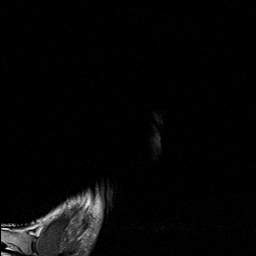

[Series 10: T2 fat-sat · axial · 3.0mm · 0.56mm/px · z∈[-92,+19]mm · 9 of 35 slices shown (3 of 3)]
[im 1/35]
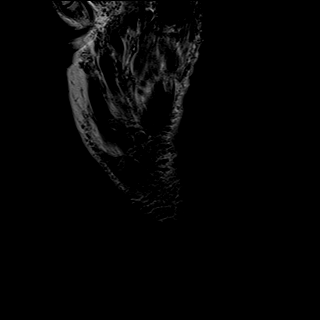
[im 5/35]
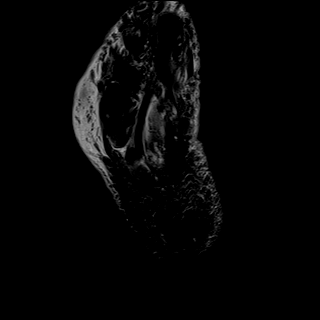
[im 9/35]
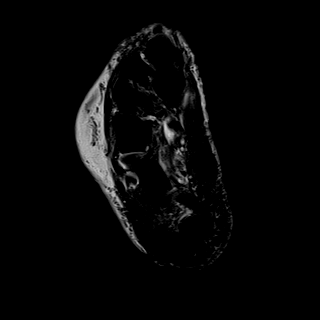
[im 13/35]
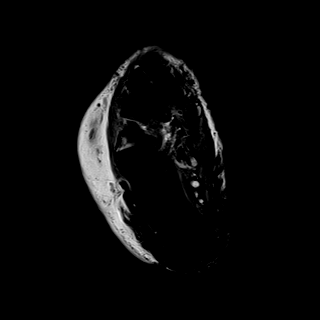
[im 18/35]
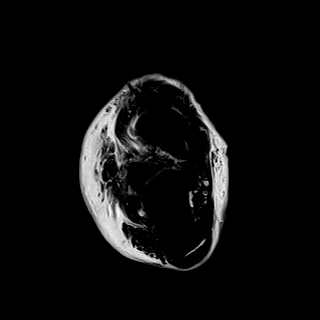
[im 22/35]
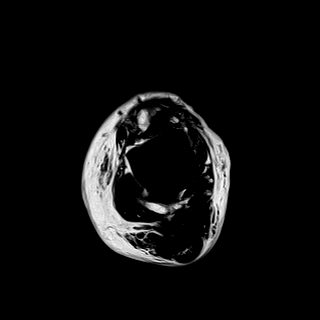
[im 26/35]
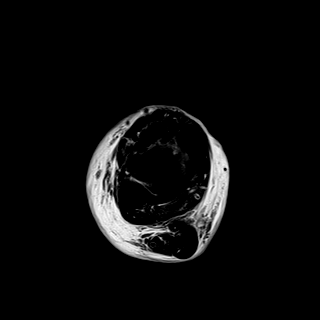
[im 30/35]
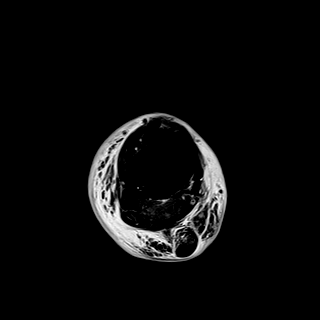
[im 35/35]
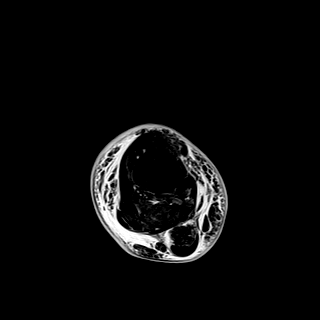

[40 of 40 positions shown; findings below may reference images not displayed]

FINDINGS: TENDONS

Peroneal: Intact peroneus longus and peroneus brevis tendons.

Posteromedial: Intact tibialis posterior, flexor hallucis longus and
flexor digitorum longus tendons.

Anterior: Intact tibialis anterior, extensor hallucis longus and
extensor digitorum longus tendons.

Achilles: Intact.

Plantar Fascia: Intact.

LIGAMENTS

Lateral: Intact

Medial: Intact

CARTILAGE

Ankle Joint: No focal chondral defect. Small posterior joint
effusion.

Subtalar Joints/Sinus Tarsi: Intact

Bones: No marrow edema or fracture. No joint dislocation. Calcaneal
enthesopathy along the plantar aspect.

Other: Diffuse periarticular soft tissue swelling of the ankle and
included foot.
IMPRESSION: Soft tissue edema of the ankle and foot. No fracture or
malalignment. Intact tendons and ligaments crossing the ankle joint.

## 2019-02-22 IMAGING — DX DG CHEST 1V PORT
1 series · 1 of 1 positions shown · non-contrast
Comparison: 03/27/2018.

CLINICAL DATA: 89-year-old female with cardiac arrhythmia.
Shortness of breath. Pleuritic chest discomfort. Initial encounter.

EXAM:
PORTABLE CHEST 1 VIEW

[chest ap]
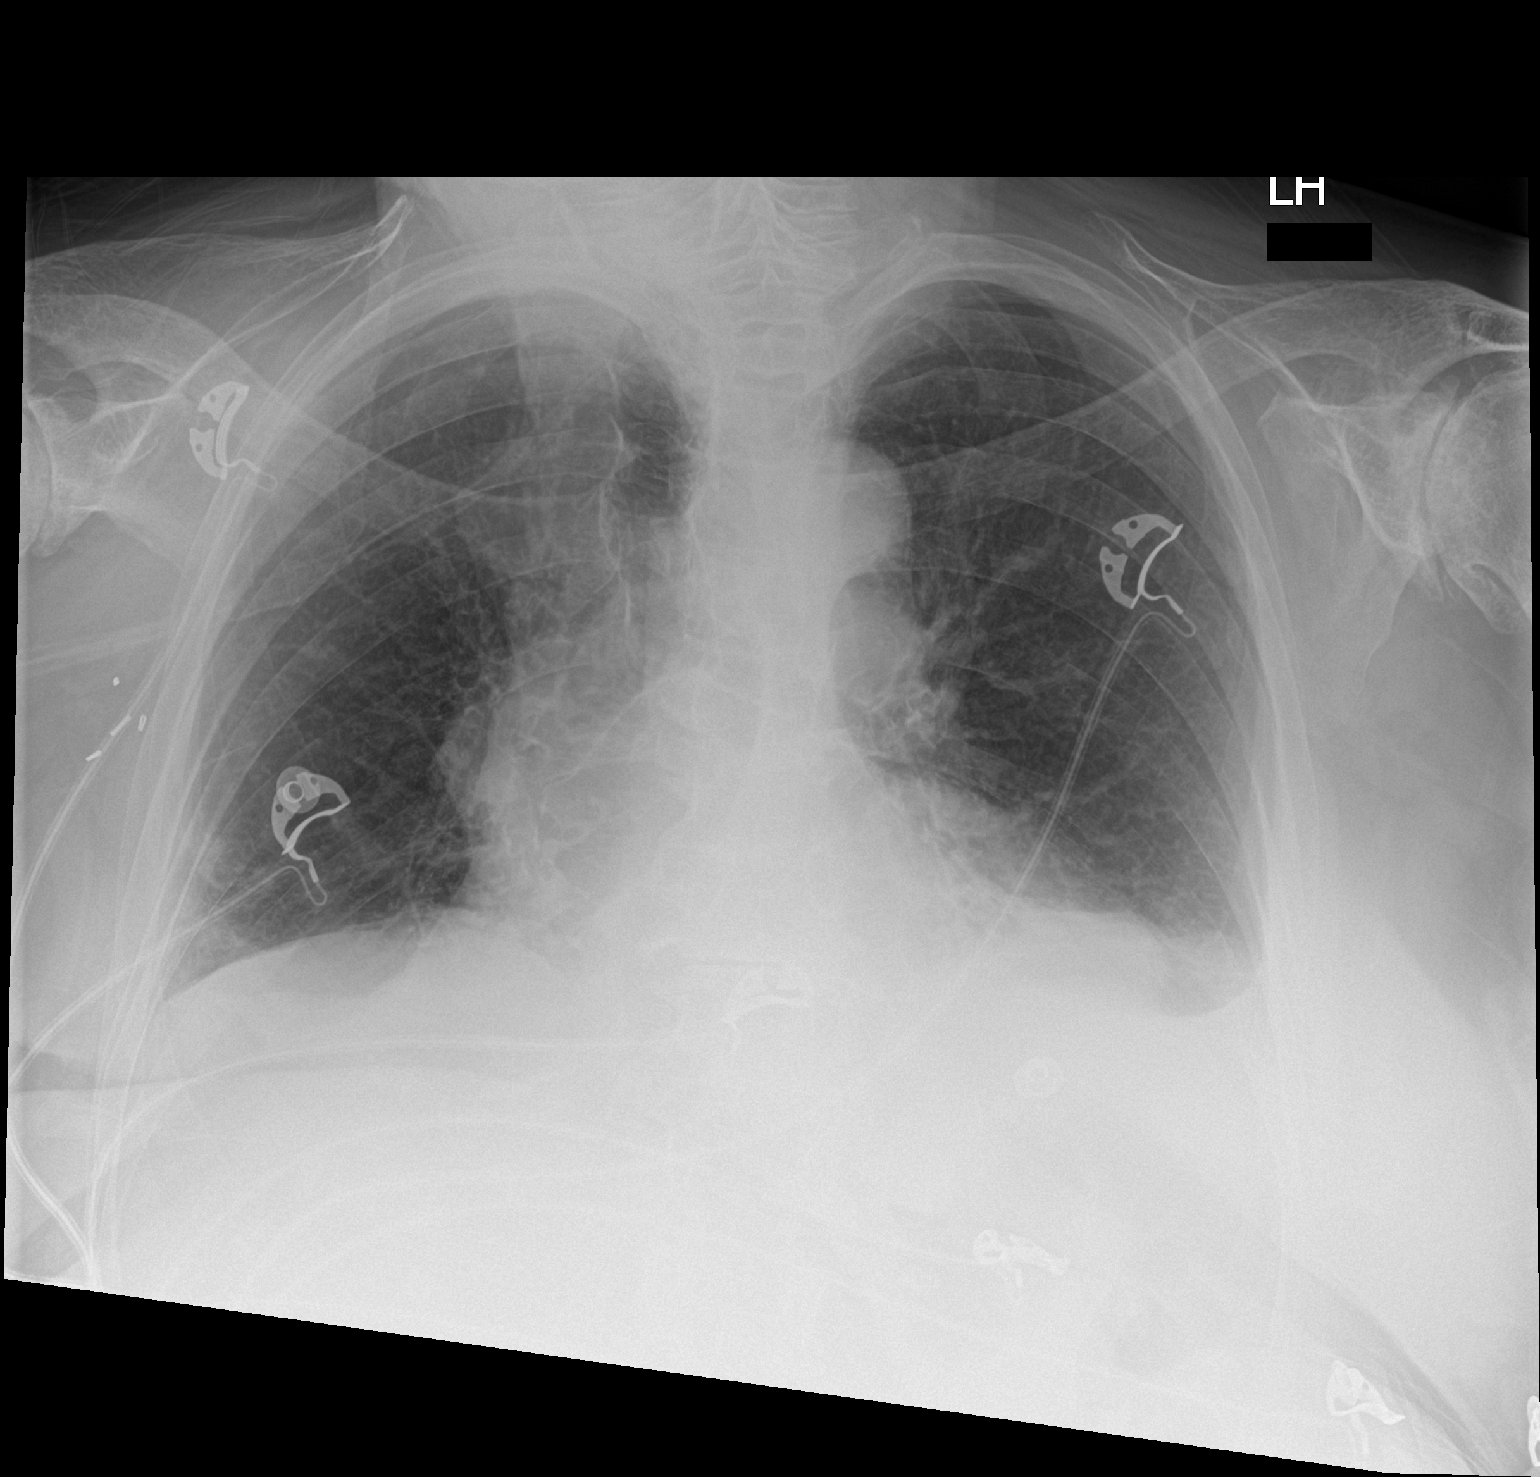

[1 of 1 positions shown; findings below may reference images not displayed]

FINDINGS: Left-sided pleural effusion.  Left base atelectasis or infiltrate.

Minimal pleural based opacity peripheral aspect right lower lobe may
represent minimal atelectasis (No well-defined consolidation as seen
with [place] hump).

Central pulmonary vascular prominence without pulmonary edema.

Cardiomegaly.

Calcified tortuous aorta.

Bilateral shoulder joint degenerative changes.
IMPRESSION: Left-sided pleural effusion. Left base atelectasis or infiltrate.

Cardiomegaly.

Aortic Atherosclerosis ([id number].X).
